# Patient Record
Sex: Female | Born: 1945 | Race: Black or African American | Hispanic: No | Marital: Single | Smoking: Former smoker
Health system: Southern US, Community
[De-identification: ages and names within clinical notes are randomized; demographics above are authoritative.]

## PROBLEM LIST (undated history)

## (undated) DIAGNOSIS — E079 Disorder of thyroid, unspecified: Secondary | ICD-10-CM

## (undated) DIAGNOSIS — I10 Essential (primary) hypertension: Secondary | ICD-10-CM

## (undated) DIAGNOSIS — I739 Peripheral vascular disease, unspecified: Secondary | ICD-10-CM

## (undated) HISTORY — PX: CHOLECYSTECTOMY: SHX55

## (undated) HISTORY — DX: Peripheral vascular disease, unspecified: I73.9

---

## 2010-04-18 ENCOUNTER — Ambulatory Visit: Payer: Self-pay | Admitting: Ophthalmology

## 2019-08-02 DIAGNOSIS — E039 Hypothyroidism, unspecified: Secondary | ICD-10-CM | POA: Diagnosis not present

## 2019-08-02 DIAGNOSIS — Z6823 Body mass index (BMI) 23.0-23.9, adult: Secondary | ICD-10-CM | POA: Diagnosis not present

## 2019-08-02 DIAGNOSIS — Z0000 Encounter for general adult medical examination without abnormal findings: Secondary | ICD-10-CM | POA: Diagnosis not present

## 2019-08-02 DIAGNOSIS — I10 Essential (primary) hypertension: Secondary | ICD-10-CM | POA: Diagnosis not present

## 2019-08-02 DIAGNOSIS — M8580 Other specified disorders of bone density and structure, unspecified site: Secondary | ICD-10-CM | POA: Diagnosis not present

## 2019-08-02 DIAGNOSIS — Z9189 Other specified personal risk factors, not elsewhere classified: Secondary | ICD-10-CM | POA: Diagnosis not present

## 2019-08-31 DIAGNOSIS — Z20822 Contact with and (suspected) exposure to covid-19: Secondary | ICD-10-CM | POA: Diagnosis not present

## 2019-08-31 DIAGNOSIS — Z01812 Encounter for preprocedural laboratory examination: Secondary | ICD-10-CM | POA: Diagnosis not present

## 2019-09-03 DIAGNOSIS — D120 Benign neoplasm of cecum: Secondary | ICD-10-CM | POA: Diagnosis not present

## 2019-09-03 DIAGNOSIS — D123 Benign neoplasm of transverse colon: Secondary | ICD-10-CM | POA: Diagnosis not present

## 2019-09-03 DIAGNOSIS — Z86010 Personal history of colonic polyps: Secondary | ICD-10-CM | POA: Diagnosis not present

## 2019-09-03 DIAGNOSIS — K5730 Diverticulosis of large intestine without perforation or abscess without bleeding: Secondary | ICD-10-CM | POA: Diagnosis not present

## 2019-09-03 DIAGNOSIS — Z1211 Encounter for screening for malignant neoplasm of colon: Secondary | ICD-10-CM | POA: Diagnosis not present

## 2019-12-15 DIAGNOSIS — Z1231 Encounter for screening mammogram for malignant neoplasm of breast: Secondary | ICD-10-CM | POA: Diagnosis not present

## 2020-02-10 DIAGNOSIS — E782 Mixed hyperlipidemia: Secondary | ICD-10-CM | POA: Diagnosis not present

## 2020-02-10 DIAGNOSIS — E039 Hypothyroidism, unspecified: Secondary | ICD-10-CM | POA: Diagnosis not present

## 2020-02-10 DIAGNOSIS — I10 Essential (primary) hypertension: Secondary | ICD-10-CM | POA: Diagnosis not present

## 2020-08-02 DIAGNOSIS — Z6824 Body mass index (BMI) 24.0-24.9, adult: Secondary | ICD-10-CM | POA: Diagnosis not present

## 2020-08-02 DIAGNOSIS — M8580 Other specified disorders of bone density and structure, unspecified site: Secondary | ICD-10-CM | POA: Diagnosis not present

## 2020-08-02 DIAGNOSIS — I10 Essential (primary) hypertension: Secondary | ICD-10-CM | POA: Diagnosis not present

## 2020-08-02 DIAGNOSIS — Z0000 Encounter for general adult medical examination without abnormal findings: Secondary | ICD-10-CM | POA: Diagnosis not present

## 2020-08-02 DIAGNOSIS — E039 Hypothyroidism, unspecified: Secondary | ICD-10-CM | POA: Diagnosis not present

## 2020-08-02 DIAGNOSIS — Z9189 Other specified personal risk factors, not elsewhere classified: Secondary | ICD-10-CM | POA: Diagnosis not present

## 2020-08-16 DIAGNOSIS — I10 Essential (primary) hypertension: Secondary | ICD-10-CM | POA: Diagnosis not present

## 2020-08-16 DIAGNOSIS — E039 Hypothyroidism, unspecified: Secondary | ICD-10-CM | POA: Diagnosis not present

## 2020-08-16 DIAGNOSIS — H9319 Tinnitus, unspecified ear: Secondary | ICD-10-CM | POA: Diagnosis not present

## 2020-09-13 DIAGNOSIS — H93A1 Pulsatile tinnitus, right ear: Secondary | ICD-10-CM | POA: Diagnosis not present

## 2020-09-13 DIAGNOSIS — H903 Sensorineural hearing loss, bilateral: Secondary | ICD-10-CM | POA: Diagnosis not present

## 2020-09-26 DIAGNOSIS — H903 Sensorineural hearing loss, bilateral: Secondary | ICD-10-CM | POA: Diagnosis not present

## 2020-09-27 DIAGNOSIS — H903 Sensorineural hearing loss, bilateral: Secondary | ICD-10-CM | POA: Diagnosis not present

## 2020-09-27 DIAGNOSIS — H93A1 Pulsatile tinnitus, right ear: Secondary | ICD-10-CM | POA: Diagnosis not present

## 2020-10-26 DIAGNOSIS — H5210 Myopia, unspecified eye: Secondary | ICD-10-CM | POA: Diagnosis not present

## 2020-10-26 DIAGNOSIS — E7800 Pure hypercholesterolemia, unspecified: Secondary | ICD-10-CM | POA: Diagnosis not present

## 2020-10-30 DIAGNOSIS — Z03818 Encounter for observation for suspected exposure to other biological agents ruled out: Secondary | ICD-10-CM | POA: Diagnosis not present

## 2020-12-15 DIAGNOSIS — M8589 Other specified disorders of bone density and structure, multiple sites: Secondary | ICD-10-CM | POA: Diagnosis not present

## 2020-12-15 DIAGNOSIS — Z780 Asymptomatic menopausal state: Secondary | ICD-10-CM | POA: Diagnosis not present

## 2020-12-15 DIAGNOSIS — Z1231 Encounter for screening mammogram for malignant neoplasm of breast: Secondary | ICD-10-CM | POA: Diagnosis not present

## 2021-01-08 DIAGNOSIS — U071 COVID-19: Secondary | ICD-10-CM | POA: Diagnosis not present

## 2021-02-19 DIAGNOSIS — Z9189 Other specified personal risk factors, not elsewhere classified: Secondary | ICD-10-CM | POA: Diagnosis not present

## 2021-02-19 DIAGNOSIS — E039 Hypothyroidism, unspecified: Secondary | ICD-10-CM | POA: Diagnosis not present

## 2021-02-19 DIAGNOSIS — I10 Essential (primary) hypertension: Secondary | ICD-10-CM | POA: Diagnosis not present

## 2021-03-26 DIAGNOSIS — M79662 Pain in left lower leg: Secondary | ICD-10-CM | POA: Diagnosis not present

## 2021-03-26 DIAGNOSIS — M25572 Pain in left ankle and joints of left foot: Secondary | ICD-10-CM | POA: Diagnosis not present

## 2021-03-27 DIAGNOSIS — M79662 Pain in left lower leg: Secondary | ICD-10-CM | POA: Diagnosis not present

## 2021-03-27 DIAGNOSIS — M25472 Effusion, left ankle: Secondary | ICD-10-CM | POA: Diagnosis not present

## 2021-04-10 DIAGNOSIS — M8580 Other specified disorders of bone density and structure, unspecified site: Secondary | ICD-10-CM | POA: Diagnosis not present

## 2021-04-10 DIAGNOSIS — K08409 Partial loss of teeth, unspecified cause, unspecified class: Secondary | ICD-10-CM | POA: Diagnosis not present

## 2021-04-10 DIAGNOSIS — Z818 Family history of other mental and behavioral disorders: Secondary | ICD-10-CM | POA: Diagnosis not present

## 2021-04-10 DIAGNOSIS — Z8379 Family history of other diseases of the digestive system: Secondary | ICD-10-CM | POA: Diagnosis not present

## 2021-04-10 DIAGNOSIS — E039 Hypothyroidism, unspecified: Secondary | ICD-10-CM | POA: Diagnosis not present

## 2021-04-10 DIAGNOSIS — Z791 Long term (current) use of non-steroidal anti-inflammatories (NSAID): Secondary | ICD-10-CM | POA: Diagnosis not present

## 2021-04-10 DIAGNOSIS — I10 Essential (primary) hypertension: Secondary | ICD-10-CM | POA: Diagnosis not present

## 2021-04-10 DIAGNOSIS — Z803 Family history of malignant neoplasm of breast: Secondary | ICD-10-CM | POA: Diagnosis not present

## 2021-04-10 DIAGNOSIS — Z8249 Family history of ischemic heart disease and other diseases of the circulatory system: Secondary | ICD-10-CM | POA: Diagnosis not present

## 2021-04-10 DIAGNOSIS — Z811 Family history of alcohol abuse and dependence: Secondary | ICD-10-CM | POA: Diagnosis not present

## 2021-04-10 DIAGNOSIS — Z823 Family history of stroke: Secondary | ICD-10-CM | POA: Diagnosis not present

## 2021-04-10 DIAGNOSIS — R69 Illness, unspecified: Secondary | ICD-10-CM | POA: Diagnosis not present

## 2021-04-10 DIAGNOSIS — E785 Hyperlipidemia, unspecified: Secondary | ICD-10-CM | POA: Diagnosis not present

## 2021-04-12 DIAGNOSIS — M76822 Posterior tibial tendinitis, left leg: Secondary | ICD-10-CM | POA: Diagnosis not present

## 2021-05-29 DIAGNOSIS — M76829 Posterior tibial tendinitis, unspecified leg: Secondary | ICD-10-CM | POA: Diagnosis not present

## 2021-05-29 DIAGNOSIS — R2242 Localized swelling, mass and lump, left lower limb: Secondary | ICD-10-CM | POA: Diagnosis not present

## 2021-06-08 DIAGNOSIS — M25572 Pain in left ankle and joints of left foot: Secondary | ICD-10-CM | POA: Diagnosis not present

## 2021-06-08 DIAGNOSIS — R2242 Localized swelling, mass and lump, left lower limb: Secondary | ICD-10-CM | POA: Diagnosis not present

## 2021-06-14 DIAGNOSIS — M76822 Posterior tibial tendinitis, left leg: Secondary | ICD-10-CM | POA: Diagnosis not present

## 2021-08-20 DIAGNOSIS — B349 Viral infection, unspecified: Secondary | ICD-10-CM | POA: Diagnosis not present

## 2021-08-24 DIAGNOSIS — Z1231 Encounter for screening mammogram for malignant neoplasm of breast: Secondary | ICD-10-CM | POA: Diagnosis not present

## 2021-08-24 DIAGNOSIS — Z0000 Encounter for general adult medical examination without abnormal findings: Secondary | ICD-10-CM | POA: Diagnosis not present

## 2021-08-24 DIAGNOSIS — M25512 Pain in left shoulder: Secondary | ICD-10-CM | POA: Diagnosis not present

## 2021-08-24 DIAGNOSIS — M8580 Other specified disorders of bone density and structure, unspecified site: Secondary | ICD-10-CM | POA: Diagnosis not present

## 2021-08-24 DIAGNOSIS — Z23 Encounter for immunization: Secondary | ICD-10-CM | POA: Diagnosis not present

## 2021-08-24 DIAGNOSIS — Z1159 Encounter for screening for other viral diseases: Secondary | ICD-10-CM | POA: Diagnosis not present

## 2021-08-24 DIAGNOSIS — Z79899 Other long term (current) drug therapy: Secondary | ICD-10-CM | POA: Diagnosis not present

## 2021-08-24 DIAGNOSIS — I10 Essential (primary) hypertension: Secondary | ICD-10-CM | POA: Diagnosis not present

## 2021-08-24 DIAGNOSIS — E7800 Pure hypercholesterolemia, unspecified: Secondary | ICD-10-CM | POA: Diagnosis not present

## 2021-08-24 DIAGNOSIS — M85852 Other specified disorders of bone density and structure, left thigh: Secondary | ICD-10-CM | POA: Diagnosis not present

## 2021-08-24 DIAGNOSIS — E890 Postprocedural hypothyroidism: Secondary | ICD-10-CM | POA: Diagnosis not present

## 2021-09-04 ENCOUNTER — Other Ambulatory Visit: Payer: Self-pay | Admitting: Family Medicine

## 2021-09-04 DIAGNOSIS — Z1231 Encounter for screening mammogram for malignant neoplasm of breast: Secondary | ICD-10-CM

## 2021-09-06 DIAGNOSIS — M25512 Pain in left shoulder: Secondary | ICD-10-CM | POA: Diagnosis not present

## 2021-11-30 DIAGNOSIS — J40 Bronchitis, not specified as acute or chronic: Secondary | ICD-10-CM | POA: Diagnosis not present

## 2021-11-30 DIAGNOSIS — R059 Cough, unspecified: Secondary | ICD-10-CM | POA: Diagnosis not present

## 2021-12-17 ENCOUNTER — Ambulatory Visit
Admission: RE | Admit: 2021-12-17 | Discharge: 2021-12-17 | Disposition: A | Discharge status: Home / Self Care | Payer: Medicare HMO | Source: Ambulatory Visit | Attending: Family Medicine | Admitting: Family Medicine

## 2021-12-17 DIAGNOSIS — Z1231 Encounter for screening mammogram for malignant neoplasm of breast: Secondary | ICD-10-CM | POA: Diagnosis not present

## 2022-01-04 DIAGNOSIS — Z8249 Family history of ischemic heart disease and other diseases of the circulatory system: Secondary | ICD-10-CM | POA: Diagnosis not present

## 2022-01-04 DIAGNOSIS — Z880 Allergy status to penicillin: Secondary | ICD-10-CM | POA: Diagnosis not present

## 2022-01-04 DIAGNOSIS — Z833 Family history of diabetes mellitus: Secondary | ICD-10-CM | POA: Diagnosis not present

## 2022-01-04 DIAGNOSIS — Z809 Family history of malignant neoplasm, unspecified: Secondary | ICD-10-CM | POA: Diagnosis not present

## 2022-01-04 DIAGNOSIS — Z823 Family history of stroke: Secondary | ICD-10-CM | POA: Diagnosis not present

## 2022-01-04 DIAGNOSIS — M1990 Unspecified osteoarthritis, unspecified site: Secondary | ICD-10-CM | POA: Diagnosis not present

## 2022-01-04 DIAGNOSIS — Z87891 Personal history of nicotine dependence: Secondary | ICD-10-CM | POA: Diagnosis not present

## 2022-01-04 DIAGNOSIS — Z791 Long term (current) use of non-steroidal anti-inflammatories (NSAID): Secondary | ICD-10-CM | POA: Diagnosis not present

## 2022-01-04 DIAGNOSIS — E785 Hyperlipidemia, unspecified: Secondary | ICD-10-CM | POA: Diagnosis not present

## 2022-01-04 DIAGNOSIS — E890 Postprocedural hypothyroidism: Secondary | ICD-10-CM | POA: Diagnosis not present

## 2022-01-04 DIAGNOSIS — Z008 Encounter for other general examination: Secondary | ICD-10-CM | POA: Diagnosis not present

## 2022-01-04 DIAGNOSIS — I10 Essential (primary) hypertension: Secondary | ICD-10-CM | POA: Diagnosis not present

## 2022-01-04 DIAGNOSIS — E559 Vitamin D deficiency, unspecified: Secondary | ICD-10-CM | POA: Diagnosis not present

## 2022-01-22 DIAGNOSIS — R059 Cough, unspecified: Secondary | ICD-10-CM | POA: Diagnosis not present

## 2022-01-22 DIAGNOSIS — J209 Acute bronchitis, unspecified: Secondary | ICD-10-CM | POA: Diagnosis not present

## 2022-02-21 DIAGNOSIS — E890 Postprocedural hypothyroidism: Secondary | ICD-10-CM | POA: Diagnosis not present

## 2022-02-21 DIAGNOSIS — E7800 Pure hypercholesterolemia, unspecified: Secondary | ICD-10-CM | POA: Diagnosis not present

## 2022-02-21 DIAGNOSIS — I10 Essential (primary) hypertension: Secondary | ICD-10-CM | POA: Diagnosis not present

## 2022-02-21 DIAGNOSIS — E559 Vitamin D deficiency, unspecified: Secondary | ICD-10-CM | POA: Diagnosis not present

## 2022-02-21 DIAGNOSIS — M25552 Pain in left hip: Secondary | ICD-10-CM | POA: Diagnosis not present

## 2022-02-21 DIAGNOSIS — Z23 Encounter for immunization: Secondary | ICD-10-CM | POA: Diagnosis not present

## 2022-02-21 DIAGNOSIS — Z7185 Encounter for immunization safety counseling: Secondary | ICD-10-CM | POA: Diagnosis not present

## 2022-02-21 DIAGNOSIS — Z1211 Encounter for screening for malignant neoplasm of colon: Secondary | ICD-10-CM | POA: Diagnosis not present

## 2022-04-03 DIAGNOSIS — M160 Bilateral primary osteoarthritis of hip: Secondary | ICD-10-CM | POA: Diagnosis not present

## 2022-04-03 DIAGNOSIS — M25551 Pain in right hip: Secondary | ICD-10-CM | POA: Diagnosis not present

## 2022-04-03 DIAGNOSIS — M25552 Pain in left hip: Secondary | ICD-10-CM | POA: Diagnosis not present

## 2022-04-30 ENCOUNTER — Ambulatory Visit: Payer: Medicare HMO | Admitting: Podiatry

## 2022-04-30 ENCOUNTER — Ambulatory Visit (INDEPENDENT_AMBULATORY_CARE_PROVIDER_SITE_OTHER): Payer: Medicare HMO

## 2022-04-30 DIAGNOSIS — S92514B Nondisplaced fracture of proximal phalanx of right lesser toe(s), initial encounter for open fracture: Secondary | ICD-10-CM

## 2022-04-30 DIAGNOSIS — L600 Ingrowing nail: Secondary | ICD-10-CM | POA: Diagnosis not present

## 2022-04-30 DIAGNOSIS — S92504A Nondisplaced unspecified fracture of right lesser toe(s), initial encounter for closed fracture: Secondary | ICD-10-CM | POA: Diagnosis not present

## 2022-04-30 DIAGNOSIS — M79671 Pain in right foot: Secondary | ICD-10-CM

## 2022-04-30 NOTE — Progress Notes (Signed)
Chief Complaint  Patient presents with   Ingrown Toenail    Possible Ingrrowns on right foot, great toe, and 3rd digit    HPI: 76 y.o. female presenting today as a new patient for evaluation of 2 separate complaints.  First, the patient states that she received a pedicure about a month ago and ever since then she has been having some tenderness to the toenail of the right great toe as well as the right third toe.  She is concerned for possible ingrown toenail.  She generally has never had this pain before up until her pedicure that she received from a different pedicurist at a different location that she normally goes to.  Patient also states that a few months ago she got her right fourth toe caught on a sandal and bent it backwards.  She says that she subsequently developed pain and tenderness with swelling to the right fourth toe and she would like to have it evaluated today.  Over the last few months the pain has improved but she continues to have some achiness with swelling  No past medical history on file.  No past surgical history on file.  Allergies  Allergen Reactions   Lisinopril     Other reaction(s): cough   Penicillins Hives and Rash     Physical Exam: General: The patient is alert and oriented x3 in no acute distress.  Dermatology: Skin is warm, dry and supple bilateral lower extremities. Negative for open lesions or macerations.  Mild evidence of an ingrowing toenail to the medial border of the right great toe as well as the lateral border of the right third toe.  Again, this is very mild with no inflammatory process or infection.  There is however tenderness and sensitivity with palpation to these areas.  Vascular: Palpable pedal pulses bilaterally. Capillary refill within normal limits.  Negative for any significant edema or erythema  Neurological: Light touch and protective threshold grossly intact  Musculoskeletal Exam: No pedal deformities noted.  There is  tenderness with palpation to the right fourth toe consistent with fracture visualized on radiographic exam.  Overall good alignment of the toes.  Radiographic Exam RT foot 04/30/2022:  Normal osseous mineralization.  Prior surgery to the DIPJ of the fourth digit right as well as the fifth digit consistent with hammertoe arthroplasty type surgery.  There is a chronic healing fracture noted transversely along the distal head of the proximal phalanx with routine healing.  Assessment: 1.  Mild ingrowing toenail medial border right great toe and lateral border right third toe 2.  Fracture proximal phalanx right fourth toe nondisplaced with routine healing x2 months   Plan of Care:  1. Patient evaluated. X-Rays reviewed.  2.  Recommend good supportive shoes that do not irritate or constrict the toebox area and explained to the patient that the toe fracture should resolve over the next month or 2 3.  In regards to the ingrown toenails, for now recommend conservative treatment.  Recommend daily lotion and massaging the nail fold away from the nail plate to see if the symptoms resolve over the next few weeks.  She has never had ingrown toenails before and these are very mild although they are sensitive with palpation.  In 2 weeks if there is no improvement return to clinic for partial nail matricectomy       Edrick Kins, DPM [facility]  Dr. Edrick Kins, DPM    [address]  [place], [place] [address]                Office [phone]  Fax [phone]

## 2022-05-01 ENCOUNTER — Other Ambulatory Visit: Payer: Self-pay | Admitting: Podiatry

## 2022-05-01 DIAGNOSIS — S92514B Nondisplaced fracture of proximal phalanx of right lesser toe(s), initial encounter for open fracture: Secondary | ICD-10-CM

## 2022-05-13 ENCOUNTER — Ambulatory Visit: Payer: Medicare HMO | Admitting: Podiatry

## 2022-05-13 DIAGNOSIS — L600 Ingrowing nail: Secondary | ICD-10-CM

## 2022-05-13 MED ORDER — MUPIROCIN 2 % EX OINT: 1.0000 | TOPICAL_OINTMENT | Freq: Two times a day (BID) | CUTANEOUS | 1 refills | Status: DC

## 2022-05-13 NOTE — Progress Notes (Signed)
   Chief Complaint  Patient presents with   Ingrown Toenail    Patient is here for  right foot ingrown toe nail great toe and 2nd toe.    Subjective: Patient presents today for evaluation of pain to the medial border right great toe as well as the lateral border right third toe. Patient is concerned for possible ingrown nail.  She has tried massaging the area over the last 2 weeks and soaking it with antibiotic ointment with no improvement or relief of symptoms.  It is very sensitive to touch.  Patient presents today for further treatment and evaluation.  No past medical history on file.  Objective:  General: Well developed, nourished, in no acute distress, alert and oriented x3   Dermatology: Skin is warm, dry and supple bilateral.  Medial border right great toe lateral border right third toe is tender with evidence of an ingrowing nail. Pain on palpation noted to the border of the nail fold. The remaining nails appear unremarkable at this time. There are no open sores, lesions.  Vascular: DP and PT pulses palpable.  No clinical evidence of vascular compromise  Neruologic: Grossly intact via light touch bilateral.  Musculoskeletal: No pedal deformity noted  Assesement: #1 Paronychia with ingrowing nail medial border right great toenail lateral border right third toe #2 fracture proximal phalanx right fourth toe nondisplaced with routine healing x2 months  Plan of Care:  1. Patient evaluated.  2. Discussed treatment alternatives and plan of care. Explained nail avulsion procedure and post procedure course to patient. 3. Patient opted for permanent partial nail avulsion of the ingrown portion of the nail.  4. Prior to procedure, local anesthesia infiltration utilized using 3 ml of a 50:50 mixture of 2% plain lidocaine and 0.5% plain marcaine in a normal hallux block fashion and a betadine prep performed.  5. Partial permanent nail avulsion with chemical matrixectomy performed using  [id number] applications of phenol followed by alcohol flush.  6. Light dressing applied.  Post care instructions provided 7.  Prescription for mupirocin 2% ointment 8.  Return to clinic 2 weeks.  Edrick Kins, DPM [facility]  Dr. Edrick Kins, DPM    [address], [place] [address]                Office [phone]  Fax [phone]

## 2022-06-05 ENCOUNTER — Ambulatory Visit: Payer: Medicare HMO | Admitting: Podiatry

## 2022-06-05 VITALS — BP 143/66 | HR 95

## 2022-06-05 DIAGNOSIS — L600 Ingrowing nail: Secondary | ICD-10-CM

## 2022-06-05 NOTE — Progress Notes (Signed)
   Chief Complaint  Patient presents with   Follow-up    Patient is here for right foot ingrown Great toe follow-up, patient states that she is still having some burning.    Subjective: 76 y.o. female presents today status post permanent nail avulsion procedure of the medial border of the right great toe that was performed on 05/13/2022.  Patient states that she is doing well.  She continues to have some pain and tenderness to the area however.  Overall there is improvement.   No past medical history on file.  Objective: Neurovascular status intact.  Skin is warm, dry and supple. Nail and respective nail fold appears to be healing appropriately.   Assessment: #1 s/p partial permanent nail matrixectomy medial border right great toe   Plan of care: #1 patient was evaluated  #2 light debridement of the periungual debris was performed to the border of the respective toe and nail plate using a tissue nipper. #3  Continue triple antibiotic daily for an additional week until the area completely resolves and heals  #4 patient is to return to clinic on a PRN basis.   Edrick Kins, DPM [facility]  Dr. Edrick Kins, DPM    [address], [place] [address]                Office [phone]  Fax [phone]

## 2022-06-10 ENCOUNTER — Ambulatory Visit: Payer: Medicare HMO | Admitting: Podiatry

## 2022-07-02 DIAGNOSIS — J189 Pneumonia, unspecified organism: Secondary | ICD-10-CM | POA: Diagnosis not present

## 2022-07-31 DIAGNOSIS — Z86010 Personal history of colonic polyps: Secondary | ICD-10-CM | POA: Diagnosis not present

## 2022-07-31 DIAGNOSIS — K5730 Diverticulosis of large intestine without perforation or abscess without bleeding: Secondary | ICD-10-CM | POA: Diagnosis not present

## 2022-08-14 ENCOUNTER — Ambulatory Visit (INDEPENDENT_AMBULATORY_CARE_PROVIDER_SITE_OTHER): Payer: Medicare HMO

## 2022-08-14 ENCOUNTER — Ambulatory Visit: Payer: Medicare HMO | Admitting: Podiatry

## 2022-08-14 DIAGNOSIS — R252 Cramp and spasm: Secondary | ICD-10-CM

## 2022-08-14 DIAGNOSIS — M25871 Other specified joint disorders, right ankle and foot: Secondary | ICD-10-CM | POA: Diagnosis not present

## 2022-08-14 DIAGNOSIS — M79671 Pain in right foot: Secondary | ICD-10-CM

## 2022-08-14 MED ORDER — BETAMETHASONE SOD PHOS & ACET 6 (3-3) MG/ML IJ SUSP
3.0000 mg | Freq: Once | INTRAMUSCULAR | Status: AC
  Administered 2022-08-14: 3 mg via INTRA_ARTICULAR

## 2022-08-14 MED ORDER — GABAPENTIN 100 MG PO CAPS: 100.0000 mg | ORAL_CAPSULE | Freq: Every day | ORAL | 1 refills | Status: DC

## 2022-08-14 NOTE — Progress Notes (Signed)
   Chief Complaint  Patient presents with   Foot Problem    Patient came in today for cramping in the ball of the foot started 2 months ago, X-rays taken today     HPI: 77 y.o. female presenting today for complaint of pain and tenderness now to the right forefoot.  She does have history of partial nail matricectomy with good healing.  Patient states that she is experiencing pain underneath her great toe joint as well as nocturnal cramps.  Patient states that approximately 3 AM in the morning she begins to experience pain and cramping to the foot.  It is consistent almost nightly.  She has not anything for treatment.  Denies a history of injury or change in shoe gear or activity.  No past medical history on file.  No past surgical history on file.  Allergies  Allergen Reactions   Lisinopril     Other reaction(s): cough   Penicillins Hives and Rash     Physical Exam: General: The patient is alert and oriented x3 in no acute distress.  Dermatology: Skin is warm, dry and supple bilateral lower extremities. Negative for open lesions or macerations.  Vascular: Palpable pedal pulses bilaterally. Capillary refill within normal limits.  Negative for any significant edema or erythema  Neurological: Light touch and protective threshold grossly intact  Musculoskeletal Exam: No pedal deformities noted.  There is tenderness with palpation along the sesamoid apparatus of the right foot consistent with sesamoiditis  Radiographic Exam RT foot 08/14/2021:  Normal osseous mineralization. Joint spaces preserved. No fracture/dislocation/boney destruction.    Assessment: 1.  Sesamoiditis right foot 2.  Nocturnal foot cramps right   Plan of Care:  1. Patient evaluated. X-Rays reviewed.  2.  Injection of 0.5 cc Celestone Soluspan injected in the sesamoid apparatus right foot 3.  Prescription for gabapentin 100 mg QHS to see if this helps alleviate some of the patient's nocturnal pain 4.  Continue  wearing good supportive shoes and sneakers 5.  Return to clinic 4 weeks      Edrick Kins, DPM [facility]  Dr. Edrick Kins, DPM    [address], [place] [address]                Office [phone]  Fax [phone]

## 2022-09-03 DIAGNOSIS — D124 Benign neoplasm of descending colon: Secondary | ICD-10-CM | POA: Diagnosis not present

## 2022-09-03 DIAGNOSIS — Z09 Encounter for follow-up examination after completed treatment for conditions other than malignant neoplasm: Secondary | ICD-10-CM | POA: Diagnosis not present

## 2022-09-03 DIAGNOSIS — D120 Benign neoplasm of cecum: Secondary | ICD-10-CM | POA: Diagnosis not present

## 2022-09-03 DIAGNOSIS — Z86010 Personal history of colonic polyps: Secondary | ICD-10-CM | POA: Diagnosis not present

## 2022-09-03 DIAGNOSIS — K648 Other hemorrhoids: Secondary | ICD-10-CM | POA: Diagnosis not present

## 2022-09-05 DIAGNOSIS — D120 Benign neoplasm of cecum: Secondary | ICD-10-CM | POA: Diagnosis not present

## 2022-09-05 DIAGNOSIS — D124 Benign neoplasm of descending colon: Secondary | ICD-10-CM | POA: Diagnosis not present

## 2022-09-06 ENCOUNTER — Ambulatory Visit
Admission: RE | Admit: 2022-09-06 | Discharge: 2022-09-06 | Disposition: A | Discharge status: Home / Self Care | Payer: Medicare HMO | Source: Ambulatory Visit | Attending: Family Medicine | Admitting: Family Medicine

## 2022-09-06 ENCOUNTER — Other Ambulatory Visit: Payer: Self-pay | Admitting: Family Medicine

## 2022-09-06 DIAGNOSIS — R600 Localized edema: Secondary | ICD-10-CM | POA: Diagnosis not present

## 2022-09-06 DIAGNOSIS — Z79899 Other long term (current) drug therapy: Secondary | ICD-10-CM | POA: Diagnosis not present

## 2022-09-06 DIAGNOSIS — R059 Cough, unspecified: Secondary | ICD-10-CM | POA: Diagnosis not present

## 2022-09-06 DIAGNOSIS — Z23 Encounter for immunization: Secondary | ICD-10-CM | POA: Diagnosis not present

## 2022-09-06 DIAGNOSIS — Z9181 History of falling: Secondary | ICD-10-CM | POA: Diagnosis not present

## 2022-09-06 DIAGNOSIS — R0609 Other forms of dyspnea: Secondary | ICD-10-CM

## 2022-09-06 DIAGNOSIS — E559 Vitamin D deficiency, unspecified: Secondary | ICD-10-CM | POA: Diagnosis not present

## 2022-09-06 DIAGNOSIS — I10 Essential (primary) hypertension: Secondary | ICD-10-CM | POA: Diagnosis not present

## 2022-09-06 DIAGNOSIS — E7800 Pure hypercholesterolemia, unspecified: Secondary | ICD-10-CM | POA: Diagnosis not present

## 2022-09-06 DIAGNOSIS — Z0000 Encounter for general adult medical examination without abnormal findings: Secondary | ICD-10-CM | POA: Diagnosis not present

## 2022-09-06 DIAGNOSIS — E890 Postprocedural hypothyroidism: Secondary | ICD-10-CM | POA: Diagnosis not present

## 2022-09-11 ENCOUNTER — Ambulatory Visit: Payer: Medicare HMO | Admitting: Podiatry

## 2022-09-11 ENCOUNTER — Encounter: Payer: Self-pay | Admitting: Student

## 2022-09-11 ENCOUNTER — Ambulatory Visit: Payer: Medicare HMO | Admitting: Student

## 2022-09-11 VITALS — BP 116/72 | HR 82 | Ht 61.5 in | Wt 125.6 lb

## 2022-09-11 DIAGNOSIS — I70221 Atherosclerosis of native arteries of extremities with rest pain, right leg: Secondary | ICD-10-CM | POA: Diagnosis not present

## 2022-09-11 DIAGNOSIS — R0600 Dyspnea, unspecified: Secondary | ICD-10-CM

## 2022-09-11 DIAGNOSIS — R053 Chronic cough: Secondary | ICD-10-CM

## 2022-09-11 MED ORDER — CYCLOBENZAPRINE HCL 10 MG PO TABS: 10.0000 mg | ORAL_TABLET | Freq: Every day | ORAL | 0 refills | Status: DC

## 2022-09-11 NOTE — Patient Instructions (Signed)
-   Breztri 2 puffs twice daily rinse mouth and brush tongue/teeth after each use - albuterol 1-2 puffs as needed this will be your rescue inhaler - PFTs (breathing tests) on same day as next visit in 3 months. Ideally try to stop your breztri (or alternative maintenance inhaler) 2-3 weeks before your PFTs - see you in 3 months or sooner if need be!

## 2022-09-11 NOTE — Progress Notes (Addendum)
   Chief Complaint  Patient presents with   Foot Pain    4 week follow up  Pt stated that things are about the same     HPI: 77 y.o. female presenting today for follow-up evaluation of pain and tenderness now to the right forefoot that only occurs at nighttime.  Patient states that the injection and the gabapentin does not help alleviate any of her symptoms.  She continues to have cramping sensation with pain on a nightly basis.  No past medical history on file.  No past surgical history on file.  Allergies  Allergen Reactions   Lisinopril     Other reaction(s): cough   Penicillins Hives and Rash     Physical Exam: General: The patient is alert and oriented x3 in no acute distress.  Dermatology: Skin is warm, dry and supple bilateral lower extremities. Negative for open lesions or macerations.  Vascular: Skin is cool to touch.  Capillary refill slightly delayed.  No edema or erythema.  Pulses seem to be diminished  Neurological: Light touch and protective threshold grossly intact  Musculoskeletal Exam: No pedal deformities noted.    Radiographic Exam RT foot 08/14/2021:  Normal osseous mineralization. Joint spaces preserved. No fracture/dislocation/boney destruction.    Assessment: 1.  Rest pain right lower extremity 2.  Nocturnal foot cramps right  -Discontinue gabapentin since she says it does not help alleviate any of her symptoms -Order placed for ABIs right lower extremity to rule out any vascular compromise -Prescription for Flexeril 10 mg nightly -Will plan to reach out to the patient via telephone when results of ABIs are available -Return to clinic as needed for now      Edrick Kins, DPM [facility]  Dr. Edrick Kins, DPM    [address], [place] [address]                Office [phone]  Fax [phone]

## 2022-09-11 NOTE — Progress Notes (Signed)
Synopsis: Referred for dyspnea by Caren Macadam, MD  Subjective:   PATIENT ID: Renetta Chalk GENDER: female DOB: [birth year], MRN: [id number]  Chief Complaint  Patient presents with   Consult    Sob during exertion     77yF with spinal stenosis, hypothyroid, ACEi cough, HTN, history of smoking - (smoked for 33 py quit in 1989), mild covid-19 infection, who is referred for dyspnea  She says that she has had frequent bouts of cough, dyspnea. She has albuterol inhaler and she does think it's helpful for her wheezy cough. She had recent course of prednisone which she thinks was helpful as well for cough, dyspnea. She says she has some swelling in her legs which isn't necessarily getting any worse. She has no orthpnea. No sinus congestion pnd. She has some intermittent reflux.   She has upcoming TTE  Has never had a stress test  Otherwise pertinent review of systems is negative.  Half sister with COPD  She worked Psychologist, educational cigarettes, was English as a second language teacher for [employer]. No recent travel. Has no pets.     No past medical history on file.   Family History  Problem Relation Age of Onset   Breast cancer Mother    Breast cancer Maternal Aunt      No past surgical history on file.  Social History   Socioeconomic History   Marital status: Single    Spouse name: Not on file   Number of children: Not on file   Years of education: Not on file   Highest education level: Not on file  Occupational History   Not on file  Tobacco Use   Smoking status: Former    Types: Cigarettes    Quit date: 1989    Years since quitting: 35.2   Smokeless tobacco: Not on file  Substance and Sexual Activity   Alcohol use: Not on file   Drug use: Not on file   Sexual activity: Not on file  Other Topics Concern   Not on file  Social History Narrative   Not on file   Social Determinants of Health   Financial Resource Strain: Not on file  Food Insecurity: Not on file   Transportation Needs: Not on file  Physical Activity: Not on file  Stress: Not on file  Social Connections: Not on file  Intimate Partner Violence: Not on file     Allergies  Allergen Reactions   Lisinopril     Other reaction(s): cough   Penicillins Hives and Rash     Outpatient Medications Prior to Visit  Medication Sig Dispense Refill   cyclobenzaprine (FLEXERIL) 10 MG tablet Take 1 tablet (10 mg total) by mouth at bedtime. 30 tablet 0   Vitamin D, Ergocalciferol, (DRISDOL) 1.25 MG (50000 UNIT) CAPS capsule Take 50,000 Units by mouth once a week.     gabapentin (NEURONTIN) 100 MG capsule Take 1 capsule (100 mg total) by mouth at bedtime. (Patient not taking: Reported on 09/11/2022) 60 capsule 1   mupirocin ointment (BACTROBAN) 2 % Apply 1 Application topically 2 (two) times daily. (Patient not taking: Reported on 09/11/2022) 22 g 1   No facility-administered medications prior to visit.       Objective:   Physical Exam:  General appearance: 77 y.o., female, NAD, conversant  Eyes: anicteric sclerae; PERRL, tracking appropriately HENT: NCAT; MMM Neck: Trachea midline; no lymphadenopathy, no JVD Lungs: CTAB, no crackles, no wheeze, with normal respiratory effort CV: RRR, no murmur  Abdomen: Soft, non-tender;  non-distended, BS present  Extremities: No peripheral edema, warm Skin: Normal turgor and texture; no rash Psych: Appropriate affect Neuro: Alert and oriented to person and place, no focal deficit     Vitals:   09/11/22 1306  BP: 116/72  Pulse: 82  SpO2: 95%  Weight: 125 lb 9.6 oz (57 kg)  Height: 5' 1.5" (1.562 m)   95% on RA BMI Readings from Last 3 Encounters:  09/11/22 23.35 kg/m   Wt Readings from Last 3 Encounters:  09/11/22 125 lb 9.6 oz (57 kg)     CBC No results found for: "WBC", "RBC", "HGB", "HCT", "PLT", "MCV", "MCH", "MCHC", "RDW", "LYMPHSABS", "MONOABS", "EOSABS", "BASOSABS"   Chest Imaging: CXR 09/26/22 reviewed by me clear  lungs  Pulmonary Functions Testing Results:     No data to display             Assessment & Plan:   # Episodic cough, dyspnea At risk for ACOS  Plan: - Breztri 2 puffs twice daily rinse mouth and brush tongue/teeth after each use - albuterol 1-2 puffs as needed this will be your rescue inhaler - PFTs (breathing tests) on same day as next visit in 3 months. Ideally try to stop your breztri (or alternative maintenance inhaler) 2-3 weeks before your PFTs - see you in 3 months or sooner if need be!     Maryjane Hurter, MD [facility] 09/11/2022 1:16 PM

## 2022-09-13 DIAGNOSIS — M7061 Trochanteric bursitis, right hip: Secondary | ICD-10-CM | POA: Diagnosis not present

## 2022-09-13 DIAGNOSIS — M160 Bilateral primary osteoarthritis of hip: Secondary | ICD-10-CM | POA: Diagnosis not present

## 2022-09-13 DIAGNOSIS — M7062 Trochanteric bursitis, left hip: Secondary | ICD-10-CM | POA: Diagnosis not present

## 2022-09-18 ENCOUNTER — Encounter: Payer: Self-pay | Admitting: Student

## 2022-09-19 ENCOUNTER — Ambulatory Visit (HOSPITAL_COMMUNITY)
Admission: RE | Admit: 2022-09-19 | Discharge: 2022-09-19 | Disposition: A | Discharge status: Home / Self Care | Payer: Medicare HMO | Source: Ambulatory Visit | Attending: Podiatry | Admitting: Podiatry

## 2022-09-19 DIAGNOSIS — I70221 Atherosclerosis of native arteries of extremities with rest pain, right leg: Secondary | ICD-10-CM | POA: Diagnosis not present

## 2022-09-19 LAB — VAS US ABI WITH/WO TBI
Left ABI: 1.15
Right ABI: 0.66

## 2022-09-20 ENCOUNTER — Other Ambulatory Visit (HOSPITAL_COMMUNITY): Payer: Self-pay

## 2022-09-20 DIAGNOSIS — R0609 Other forms of dyspnea: Secondary | ICD-10-CM | POA: Diagnosis not present

## 2022-09-20 NOTE — Telephone Encounter (Signed)
At this time Shannon Jenkins is showing as covered with a $47.00 co-pay. The other triple therapy option of Trelegy is also a $47.00 co-pay at this time.

## 2022-09-23 MED ORDER — BREZTRI AEROSPHERE 160-9-4.8 MCG/ACT IN AERO: 2.0000 | INHALATION_SPRAY | Freq: Two times a day (BID) | RESPIRATORY_TRACT | 4 refills | Status: DC

## 2022-09-24 ENCOUNTER — Other Ambulatory Visit: Payer: Self-pay | Admitting: Podiatry

## 2022-09-24 DIAGNOSIS — I70221 Atherosclerosis of native arteries of extremities with rest pain, right leg: Secondary | ICD-10-CM

## 2022-09-24 NOTE — Progress Notes (Signed)
Referral placed to [facility] in [place].

## 2022-10-17 DIAGNOSIS — I739 Peripheral vascular disease, unspecified: Secondary | ICD-10-CM | POA: Diagnosis not present

## 2022-10-17 DIAGNOSIS — E890 Postprocedural hypothyroidism: Secondary | ICD-10-CM | POA: Diagnosis not present

## 2022-10-17 DIAGNOSIS — I10 Essential (primary) hypertension: Secondary | ICD-10-CM | POA: Diagnosis not present

## 2022-10-17 DIAGNOSIS — J4489 Other specified chronic obstructive pulmonary disease: Secondary | ICD-10-CM | POA: Diagnosis not present

## 2022-10-17 DIAGNOSIS — Z1231 Encounter for screening mammogram for malignant neoplasm of breast: Secondary | ICD-10-CM | POA: Diagnosis not present

## 2022-10-18 ENCOUNTER — Ambulatory Visit: Payer: Medicare HMO | Admitting: Vascular Surgery

## 2022-10-18 ENCOUNTER — Encounter: Payer: Self-pay | Admitting: Vascular Surgery

## 2022-10-18 ENCOUNTER — Other Ambulatory Visit: Payer: Self-pay | Admitting: Family Medicine

## 2022-10-18 VITALS — BP 125/74 | HR 84 | Temp 97.7°F | Resp 14 | Ht 61.5 in | Wt 125.0 lb

## 2022-10-18 DIAGNOSIS — I70221 Atherosclerosis of native arteries of extremities with rest pain, right leg: Secondary | ICD-10-CM

## 2022-10-18 DIAGNOSIS — Z1231 Encounter for screening mammogram for malignant neoplasm of breast: Secondary | ICD-10-CM

## 2022-10-18 NOTE — Progress Notes (Signed)
Patient ID: Shannon Jenkins, female   DOB: 02-May-1946, 77 y.o.   MRN: [id number]  Reason for Consult: New Patient (Initial Visit) (Abnormal ABI'S  right leg pain radiating to right buttock)   Referred by Shannon Jenkins, DPM  Subjective:     HPI:  Shannon Jenkins is a 77 y.o. female has been evaluated for nocturnal foot pain with podiatry and found to have significantly decreased ABIs.  She is a former smoker but quit many years ago.  No other risk factors for vascular disease.  She states that she walks without limitation.  She denies any tissue loss or ulceration no history of stroke, TIA or amaurosis and no history of coronary artery disease.  She takes a statin weekly and has been prescribed 81 mg of aspirin has yet to start.  States that her foot pain really comes on the right foot in the middle the night and resolves with hanging her foot over the side of the bed.  Denies any previous lower extremity interventions and has not had this issue up until about 1 month ago.  History reviewed. No pertinent past medical history. Family History  Problem Relation Age of Onset   Breast cancer Mother    Breast cancer Maternal Aunt    History reviewed. No pertinent surgical history.  Short Social History:  Social History   Tobacco Use   Smoking status: Former    Types: Cigarettes    Quit date: 1989    Years since quitting: 35.3   Smokeless tobacco: Not on file  Substance Use Topics   Alcohol use: Not on file    Allergies  Allergen Reactions   Lisinopril     Other reaction(s): cough   Penicillins Hives and Rash    Current Outpatient Medications  Medication Sig Dispense Refill   Budeson-Glycopyrrol-Formoterol (BREZTRI AEROSPHERE) 160-9-4.8 MCG/ACT AERO Inhale 2 puffs into the lungs in the morning and at bedtime. 10.7 g 4   cyclobenzaprine (FLEXERIL) 10 MG tablet Take 1 tablet (10 mg total) by mouth at bedtime. 30 tablet 0   gabapentin (NEURONTIN) 100 MG capsule Take 1 capsule  (100 mg total) by mouth at bedtime. (Patient not taking: Reported on 09/11/2022) 60 capsule 1   mupirocin ointment (BACTROBAN) 2 % Apply 1 Application topically 2 (two) times daily. (Patient not taking: Reported on 09/11/2022) 22 g 1   Vitamin D, Ergocalciferol, (DRISDOL) 1.25 MG (50000 UNIT) CAPS capsule Take 50,000 Units by mouth once a week.     No current facility-administered medications for this visit.    Review of Systems  Constitutional:  Constitutional negative. HENT: HENT negative.  Eyes: Eyes negative.  Respiratory: Respiratory negative.  Cardiovascular: Cardiovascular negative.  GI: Gastrointestinal negative.  Musculoskeletal: Positive for leg pain.  Neurological: Neurological negative. Psychiatric: Psychiatric negative.        Objective:  Objective  Vitals:   10/18/22 0947  BP: 125/74  Pulse: 84  Resp: 14  Temp: 97.7 F (36.5 C)  SpO2: 96%     Physical Exam HENT:     Head: Normocephalic.     Nose: Nose normal.  Eyes:     Pupils: Pupils are equal, round, and reactive to light.  Neck:     Vascular: Carotid bruit present.  Cardiovascular:     Rate and Rhythm: Normal rate.     Pulses:          Femoral pulses are 2+ on the right side and 2+ on the left side.  Popliteal pulses are 0 on the right side and 2+ on the left side.       Dorsalis pedis pulses are 0 on the right side and 2+ on the left side.       Posterior tibial pulses are 0 on the right side.  Pulmonary:     Effort: Pulmonary effort is normal.  Abdominal:     General: Abdomen is flat.     Palpations: Abdomen is soft.  Skin:    General: Skin is warm.     Capillary Refill: Capillary refill takes more than 3 seconds.  Neurological:     General: No focal deficit present.     Mental Status: She is alert.  Psychiatric:        Mood and Affect: Mood normal.        Thought Content: Thought content normal.        Judgment: Judgment normal.     Data: ABI Findings:   +---------+------------------+-----+-------------------+--------+  Right   Rt Pressure (mmHg)IndexWaveform           Comment   +---------+------------------+-----+-------------------+--------+  Brachial 112                                                 +---------+------------------+-----+-------------------+--------+  PTA     79                0.66 dampened monophasic          +---------+------------------+-----+-------------------+--------+  DP      64                0.53 monophasic                   +---------+------------------+-----+-------------------+--------+  Great Toe13                0.11 Abnormal                     +---------+------------------+-----+-------------------+--------+   +---------+------------------+-----+---------+-------+  Left    Lt Pressure (mmHg)IndexWaveform Comment  +---------+------------------+-----+---------+-------+  Brachial 120                                      +---------+------------------+-----+---------+-------+  PTA     138               1.15 triphasic         +---------+------------------+-----+---------+-------+  DP      132               1.10 triphasic         +---------+------------------+-----+---------+-------+  Great Toe76                0.63 Abnormal          +---------+------------------+-----+---------+-------+   +-------+-----------+-----------+------------+------------+  ABI/TBIToday's ABIToday's TBIPrevious ABIPrevious TBI  +-------+-----------+-----------+------------+------------+  Right 0.66       0.11                                 +-------+-----------+-----------+------------+------------+  Left  1.15       0.63                                 +-------+-----------+-----------+------------+------------+  Summary:  Right: Resting right ankle-brachial index 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 The right  toe-brachial index is abnormal.   Left: Resting left ankle-brachial index is within normal range. The left  toe-brachial index is abnormal.      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  I discussed with her the need to begin the 81 mg aspirin along with the statin that she takes and I have recommended her for angiography from the left common femoral approach to evaluate her aorta and probable treatment of her right SFA popliteal segment with possible infrapopliteal disease as well although less likely given the lack of wounds.  Patient demonstrated good understanding would like some time to do more research prior to considering angiography which is certainly reasonable.  If she has more questions I am certainly happy to discuss with her over the 5 or again in the office she will otherwise call back to schedule aortogram with possible treatment of right SFA to popliteal segment.    Maeola Harman MD [facility]

## 2022-11-22 DIAGNOSIS — I10 Essential (primary) hypertension: Secondary | ICD-10-CM | POA: Diagnosis not present

## 2022-11-27 DIAGNOSIS — Z135 Encounter for screening for eye and ear disorders: Secondary | ICD-10-CM | POA: Diagnosis not present

## 2022-11-27 DIAGNOSIS — Z961 Presence of intraocular lens: Secondary | ICD-10-CM | POA: Diagnosis not present

## 2022-11-27 DIAGNOSIS — H52223 Regular astigmatism, bilateral: Secondary | ICD-10-CM | POA: Diagnosis not present

## 2022-12-04 DIAGNOSIS — Z87891 Personal history of nicotine dependence: Secondary | ICD-10-CM | POA: Diagnosis not present

## 2022-12-04 DIAGNOSIS — Z8249 Family history of ischemic heart disease and other diseases of the circulatory system: Secondary | ICD-10-CM | POA: Diagnosis not present

## 2022-12-04 DIAGNOSIS — E785 Hyperlipidemia, unspecified: Secondary | ICD-10-CM | POA: Diagnosis not present

## 2022-12-04 DIAGNOSIS — I10 Essential (primary) hypertension: Secondary | ICD-10-CM | POA: Diagnosis not present

## 2022-12-04 DIAGNOSIS — Z803 Family history of malignant neoplasm of breast: Secondary | ICD-10-CM | POA: Diagnosis not present

## 2022-12-04 DIAGNOSIS — E890 Postprocedural hypothyroidism: Secondary | ICD-10-CM | POA: Diagnosis not present

## 2022-12-04 DIAGNOSIS — Z008 Encounter for other general examination: Secondary | ICD-10-CM | POA: Diagnosis not present

## 2022-12-04 DIAGNOSIS — J449 Chronic obstructive pulmonary disease, unspecified: Secondary | ICD-10-CM | POA: Diagnosis not present

## 2022-12-04 DIAGNOSIS — Z825 Family history of asthma and other chronic lower respiratory diseases: Secondary | ICD-10-CM | POA: Diagnosis not present

## 2022-12-04 DIAGNOSIS — Z823 Family history of stroke: Secondary | ICD-10-CM | POA: Diagnosis not present

## 2022-12-04 DIAGNOSIS — M1990 Unspecified osteoarthritis, unspecified site: Secondary | ICD-10-CM | POA: Diagnosis not present

## 2022-12-04 DIAGNOSIS — Z880 Allergy status to penicillin: Secondary | ICD-10-CM | POA: Diagnosis not present

## 2022-12-04 DIAGNOSIS — I739 Peripheral vascular disease, unspecified: Secondary | ICD-10-CM | POA: Diagnosis not present

## 2022-12-06 DIAGNOSIS — I10 Essential (primary) hypertension: Secondary | ICD-10-CM | POA: Diagnosis not present

## 2022-12-06 DIAGNOSIS — E890 Postprocedural hypothyroidism: Secondary | ICD-10-CM | POA: Diagnosis not present

## 2022-12-11 ENCOUNTER — Other Ambulatory Visit: Payer: Self-pay

## 2022-12-11 DIAGNOSIS — R0600 Dyspnea, unspecified: Secondary | ICD-10-CM

## 2022-12-11 DIAGNOSIS — R053 Chronic cough: Secondary | ICD-10-CM

## 2022-12-12 ENCOUNTER — Other Ambulatory Visit (HOSPITAL_COMMUNITY): Payer: Self-pay

## 2022-12-12 ENCOUNTER — Encounter: Payer: Self-pay | Admitting: Primary Care

## 2022-12-12 ENCOUNTER — Telehealth: Payer: Self-pay | Admitting: Primary Care

## 2022-12-12 ENCOUNTER — Ambulatory Visit (INDEPENDENT_AMBULATORY_CARE_PROVIDER_SITE_OTHER): Payer: Medicare HMO | Admitting: Student

## 2022-12-12 ENCOUNTER — Ambulatory Visit: Payer: Medicare HMO | Admitting: Primary Care

## 2022-12-12 VITALS — BP 122/60 | HR 65 | Temp 98.1°F | Ht 61.5 in | Wt 126.4 lb

## 2022-12-12 DIAGNOSIS — R0600 Dyspnea, unspecified: Secondary | ICD-10-CM | POA: Diagnosis not present

## 2022-12-12 DIAGNOSIS — E039 Hypothyroidism, unspecified: Secondary | ICD-10-CM | POA: Insufficient documentation

## 2022-12-12 DIAGNOSIS — I10 Essential (primary) hypertension: Secondary | ICD-10-CM | POA: Insufficient documentation

## 2022-12-12 DIAGNOSIS — J4489 Other specified chronic obstructive pulmonary disease: Secondary | ICD-10-CM | POA: Diagnosis not present

## 2022-12-12 DIAGNOSIS — G5600 Carpal tunnel syndrome, unspecified upper limb: Secondary | ICD-10-CM | POA: Insufficient documentation

## 2022-12-12 DIAGNOSIS — R053 Chronic cough: Secondary | ICD-10-CM | POA: Diagnosis not present

## 2022-12-12 DIAGNOSIS — K219 Gastro-esophageal reflux disease without esophagitis: Secondary | ICD-10-CM | POA: Insufficient documentation

## 2022-12-12 LAB — PULMONARY FUNCTION TEST
DL/VA % pred: 86 %
DL/VA: 3.61 ml/min/mmHg/L
DLCO cor % pred: 76 %
DLCO cor: 13.38 ml/min/mmHg
DLCO unc % pred: 76 %
DLCO unc: 13.38 ml/min/mmHg
FEF 25-75 Post: 0.75 L/sec
FEF 25-75 Pre: 0.47 L/sec
FEF2575-%Change-Post: 59 %
FEF2575-%Pred-Post: 49 %
FEF2575-%Pred-Pre: 31 %
FEV1-%Change-Post: 20 %
FEV1-%Pred-Post: 59 %
FEV1-%Pred-Pre: 49 %
FEV1-Post: 1.12 L
FEV1-Pre: 0.93 L
FEV1FVC-%Change-Post: 5 %
FEV1FVC-%Pred-Pre: 75 %
FEV6-%Change-Post: 16 %
FEV6-%Pred-Post: 77 %
FEV6-%Pred-Pre: 66 %
FEV6-Post: 1.85 L
FEV6-Pre: 1.58 L
FEV6FVC-%Change-Post: 0 %
FEV6FVC-%Pred-Post: 104 %
FEV6FVC-%Pred-Pre: 104 %
FVC-%Change-Post: 13 %
FVC-%Pred-Post: 74 %
FVC-%Pred-Pre: 65 %
FVC-Post: 1.86 L
FVC-Pre: 1.64 L
Post FEV1/FVC ratio: 60 %
Post FEV6/FVC ratio: 99 %
Pre FEV1/FVC ratio: 57 %
Pre FEV6/FVC Ratio: 99 %
RV % pred: 199 %
RV: 4.31 L
TLC % pred: 130 %
TLC: 6.12 L

## 2022-12-12 NOTE — Assessment & Plan Note (Addendum)
-   Former smoker quit in 1989, 20 pack year smoking hx. She experiences mild dyspnea/wheezing with exertion. Not limited by her breathing. No associated Cough.  CAT score 5.  Some perceived benefit from trial [employer].  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 Since she is minimally symptomatic, recommend deescalating therapy to ICS/LABA. We will check with pharmacy to see what inhaler is formulary on her plan.  For now she will continue Breztri until she finishes current inhaler.  Needs alpha-1 testing today. Advise patient take Mucinex 600 mg twice daily as needed for chest congestion/cough. Follow-up in 6 months or sooner if needed.

## 2022-12-12 NOTE — Progress Notes (Signed)
@Patient  ID: Shannon Jenkins, female    DOB: June 05, 1946, 77 y.o.   MRN: [id number]  Chief Complaint  Patient presents with   Follow-up    Had PFT today-doing good    Referring provider: Aliene Beams, MD  HPI: 42 year, former smoker. PMH significant for hypertension, GERD and hypothyroidism.   12/12/2022 Patient presents today for a follow-up with pulmonary function testing. She was seen by Dr. Thora Lance for initial consult in March for dyspnea. She was started on Breztri 2 puffs twice daily and ordered for pulmonary function testing.  Former smoker quit in 1989, 20 pack year smoking hx. She is minimally symptomatic. Is not particularly effected by her breathing. She only experiences shortness of breath symptoms when walking really fast or when she gets an upper respiratory infection. Her daughter did noticed her breathing was better. Wheezing improved while taking Breztri. She is not limited doing any activities due to her breathing. CAT 5   Pulmonary function testing  12/12/2022 FVC 1.86 (74%), FEV1 1.12 (59%), ratio 60, TLC 130%, DLCO 13.36 (76%) Impression: Moderate obstruction with positive bronchodilator response and mild diffusion defect   Allergies  Allergen Reactions   Lisinopril     Other reaction(s): cough   Penicillins Hives and Rash    Immunization History  Administered Date(s) Administered   PFIZER Comirnaty(Gray Top)Covid-19 Tri-Sucrose Vaccine 08/03/2019, 08/26/2019, 04/06/2020    No past medical history on file.  Tobacco History: Social History   Tobacco Use  Smoking Status Former   Types: Cigarettes   Quit date: 1989   Years since quitting: 35.4  Smokeless Tobacco Not on file   Counseling given: Not Answered   Outpatient Medications Prior to Visit  Medication Sig Dispense Refill   Budeson-Glycopyrrol-Formoterol (BREZTRI AEROSPHERE) 160-9-4.8 MCG/ACT AERO Inhale 2 puffs into the lungs in the morning and at bedtime. (Patient not taking: Reported on  10/18/2022) 10.7 g 4   cyclobenzaprine (FLEXERIL) 10 MG tablet Take 1 tablet (10 mg total) by mouth at bedtime. 30 tablet 0   gabapentin (NEURONTIN) 100 MG capsule Take 1 capsule (100 mg total) by mouth at bedtime. 60 capsule 1   mupirocin ointment (BACTROBAN) 2 % Apply 1 Application topically 2 (two) times daily. (Patient not taking: Reported on 09/11/2022) 22 g 1   Vitamin D, Ergocalciferol, (DRISDOL) 1.25 MG (50000 UNIT) CAPS capsule Take 50,000 Units by mouth once a week.     No facility-administered medications prior to visit.   Review of Systems  Review of Systems  Constitutional: Negative.   HENT: Negative.    Respiratory:  Negative for chest tightness, shortness of breath and wheezing.      Physical Exam  BP 122/60 (BP Location: Left Arm, Cuff Size: Normal)   Pulse 65   Temp 98.1 F (36.7 C) (Temporal)   Ht 5' 1.5" (1.562 m)   Wt 126 lb 6.4 oz (57.3 kg)   SpO2 99%   BMI 23.50 kg/m  Physical Exam Constitutional:      Appearance: Normal appearance.  HENT:     Head: Normocephalic and atraumatic.     Mouth/Throat:     Mouth: Mucous membranes are moist.     Pharynx: Oropharynx is clear.  Cardiovascular:     Rate and Rhythm: Normal rate and regular rhythm.  Pulmonary:     Effort: Pulmonary effort is normal.     Breath sounds: Normal breath sounds. No wheezing, rhonchi or rales.  Skin:    General: Skin is warm and dry.  Neurological:     General: No focal deficit present.     Mental Status: She is alert and oriented to person, place, and time. Mental status is at baseline.  Psychiatric:        Mood and Affect: Mood normal.        Behavior: Behavior normal.        Thought Content: Thought content normal.        Judgment: Judgment normal.      Lab Results:  CBC No results found for: "WBC", "RBC", "HGB", "HCT", "PLT", "MCV", "MCH", "MCHC", "RDW", "LYMPHSABS", "MONOABS", "EOSABS", "BASOSABS"  BMET No results found for: "NA", "K", "CL", "CO2", "GLUCOSE", "BUN",  "CREATININE", "CALCIUM", "GFRNONAA", "GFRAA"  BNP No results found for: "BNP"  ProBNP No results found for: "PROBNP"  Imaging: No results found.   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 She experiences mild dyspnea/wheezing with exertion. Not limited by her breathing. No associated Cough.  CAT score 5.  Some perceived benefit from trial [employer].  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 Since she is minimally symptomatic, recommend deescalating therapy to ICS/LABA. We will check with pharmacy to see what inhaler is formulary on her plan.  For now she will continue Breztri until she finishes current inhaler.  Needs alpha-1 testing today. Advise patient take Mucinex 600 mg twice daily as needed for chest congestion/cough. Follow-up in 6 months or sooner if needed.     Glenford Bayley, NP 12/12/2022

## 2022-12-12 NOTE — Patient Instructions (Signed)
Full PFT performed today. °

## 2022-12-12 NOTE — Telephone Encounter (Signed)
What ICS/LABA is formulary or cheapest on plan?

## 2022-12-12 NOTE — Progress Notes (Signed)
Full PFT performed today. °

## 2022-12-12 NOTE — Patient Instructions (Addendum)
Pulmonary function testing today showed moderate obstructive lung disease with reversibility consistent with COPD/asthma overlap  Since you are minimally symptomatic recommend de-escalating therapy to something like Symbicort or Advair.  I would check with your pharmacy to see what is formulary on plan.  For now continue Breztri 2 puffs twice daily.  Use albuterol 2 puffs every 4-6 hours as needed for breakthrough shortness of breath or wheezing. Take Mucinex 600 mg twice daily to loosen congestion.  Notify office if you develop increased cough or shortness of breath.    Orders: Labs today (alpha-1 phenotype)   Follow-up: 6 months or sooner if needed with either Dr. Celine Mans or Dr. Francine Graven (Former [facility] patient-30 MIN)  Chronic Obstructive Pulmonary Disease  Chronic obstructive pulmonary disease (COPD) is a long-term (chronic) lung problem. When you have COPD, it is hard for air to get in and out of your lungs. Usually the condition gets worse over time, and your lungs will never return to normal. There are things you can do to keep yourself as healthy as possible. What are the causes? Smoking. This is the most common cause. Certain genes passed from parent to child (inherited). What increases the risk? Being exposed to secondhand smoke from cigarettes, pipes, or cigars. Being exposed to chemicals and other irritants, such as fumes and dust in the work environment. Having chronic lung conditions or infections. What are the signs or symptoms? Shortness of breath, especially during physical activity. A long-term cough with a large amount of thick mucus. Sometimes, the cough may not have any mucus (dry cough). Wheezing. Breathing quickly. Skin that looks gray or blue, especially in the fingers, toes, or lips. Feeling tired (fatigue). Weight loss. Chest tightness. Having infections often. Episodes when breathing symptoms become much worse (exacerbations). At the later stages of this  disease, you may have swelling in the ankles, feet, or legs. How is this treated? Taking medicines. Quitting smoking, if you smoke. Rehabilitation. This includes steps to make your body work better. It may involve a team of specialists. Doing exercises. Making changes to your diet. Using oxygen. Lung surgery. Lung transplant. Comfort measures (palliative care). Follow these instructions at home: Medicines Take over-the-counter and prescription medicines only as told by your doctor. Talk to your doctor before taking any cough or allergy medicines. You may need to avoid medicines that cause your lungs to be dry. Lifestyle If you smoke, stop smoking. Smoking makes the problem worse. Do not smoke or use any products that contain nicotine or tobacco. If you need help quitting, ask your doctor. Avoid being around things that make your breathing worse. This may include smoke, chemicals, and fumes. Stay active, but remember to rest as well. Learn and use tips on how to manage stress and control your breathing. Make sure you get enough sleep. Most adults need at least 7 hours of sleep every night. Eat healthy foods. Eat smaller meals more often. Rest before meals. Controlled breathing Learn and use tips on how to control your breathing as told by your doctor. Try: Breathing in (inhaling) through your nose for 1 second. Then, pucker your lips and breath out (exhale) through your lips for 2 seconds. Putting one hand on your belly (abdomen). Breathe in slowly through your nose for 1 second. Your hand on your belly should move out. Pucker your lips and breathe out slowly through your lips. Your hand on your belly should move in as you breathe out.  Controlled coughing Learn and use controlled coughing to clear  mucus from your lungs. Follow these steps: Lean your head a little forward. Breathe in deeply. Try to hold your breath for 3 seconds. Keep your mouth slightly open while coughing 2  times. Spit any mucus out into a tissue. Rest and do the steps again 1 or 2 times as needed. General instructions Make sure you get all the shots (vaccines) that your doctor recommends. Ask your doctor about a flu shot and a pneumonia shot. Use oxygen therapy and pulmonary rehabilitation if told by your doctor. If you need home oxygen therapy, ask your doctor if you should buy a tool to measure your oxygen level (oximeter). Make a COPD action plan with your doctor. This helps you to know what to do if you feel worse than usual. Manage any other conditions you have as told by your doctor. Avoid going outside when it is very hot, cold, or humid. Avoid people who have a sickness you can catch (contagious). Keep all follow-up visits. Contact a doctor if: You cough up more mucus than usual. There is a change in the color or thickness of the mucus. It is harder to breathe than usual. Your breathing is faster than usual. You have trouble sleeping. You need to use your medicines more often than usual. You have trouble doing your normal activities such as getting dressed or walking around the house. Get help right away if: You have shortness of breath while resting. You have shortness of breath that stops you from: Being able to talk. Doing normal activities. Your chest hurts for longer than 5 minutes. Your skin color is more blue than usual. Your pulse oximeter shows that you have low oxygen for longer than 5 minutes. You have a fever. You feel too tired to breathe normally. These symptoms may represent a serious problem that is an emergency. Do not wait to see if the symptoms will go away. Get medical help right away. Call your local emergency services (911 in the [place] Do not drive yourself to the hospital. Summary Chronic obstructive pulmonary disease (COPD) is a long-term lung problem. The way your lungs work will never return to normal. Usually the condition gets worse over time. There  are things you can do to keep yourself as healthy as possible. Take over-the-counter and prescription medicines only as told by your doctor. If you smoke, stop. Smoking makes the problem worse. This information is not intended to replace advice given to you by your health care provider. Make sure you discuss any questions you have with your health care provider. Document Revised: 04/24/2020 Document Reviewed: 04/25/2020 Elsevier Patient Education  2024 [employer].

## 2022-12-12 NOTE — Telephone Encounter (Signed)
Test claims return the following for ICS+LABA:  Advair Diskus (Brand) Requires [place]  (Generic) $10.00 Advair HFA (Brand) $100.00  (Generic) Requires PA [place]  $10.00 Breo  $47.00 Dulera  $100.00 Symbicort (Brand) Requires [place] (Generic) $47.00

## 2022-12-19 ENCOUNTER — Ambulatory Visit
Admission: RE | Admit: 2022-12-19 | Discharge: 2022-12-19 | Disposition: A | Discharge status: Home / Self Care | Payer: Medicare HMO | Source: Ambulatory Visit | Attending: Family Medicine | Admitting: Family Medicine

## 2022-12-19 DIAGNOSIS — Z1231 Encounter for screening mammogram for malignant neoplasm of breast: Secondary | ICD-10-CM | POA: Diagnosis not present

## 2022-12-19 LAB — ALPHA-1 ANTITRYPSIN PHENOTYPE: A-1 Antitrypsin, Ser: 112 mg/dL (ref 83–199)

## 2023-01-06 DIAGNOSIS — M7061 Trochanteric bursitis, right hip: Secondary | ICD-10-CM | POA: Diagnosis not present

## 2023-01-06 DIAGNOSIS — M7062 Trochanteric bursitis, left hip: Secondary | ICD-10-CM | POA: Diagnosis not present

## 2023-01-24 IMAGING — MG MM DIGITAL SCREENING BILAT W/ TOMO AND CAD
6 of 12 series · 6 of 36 positions shown · non-contrast
Comparison: Previous exam(s).

CLINICAL DATA: Screening.

EXAM:
DIGITAL SCREENING BILATERAL MAMMOGRAM WITH TOMOSYNTHESIS AND CAD
TECHNIQUE: Bilateral screening digital craniocaudal and mediolateral oblique
mammograms were obtained. Bilateral screening digital breast
tomosynthesis was performed. The images were evaluated with
computer-aided detection.

[R CC synth-2D]
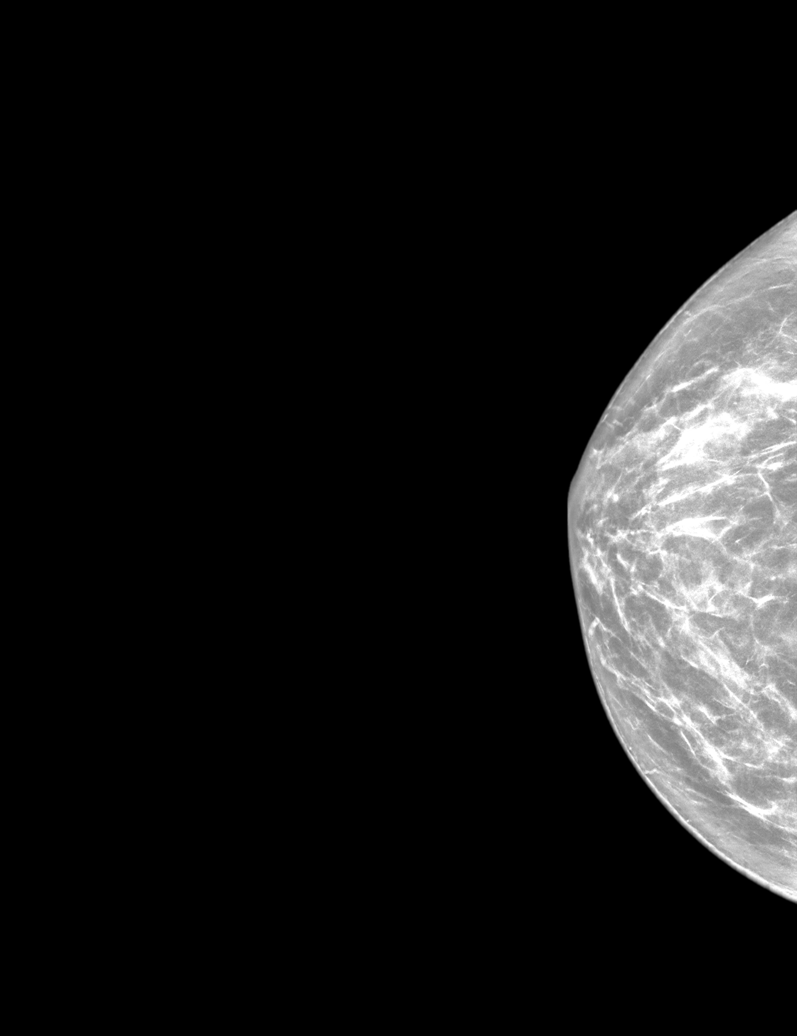

[L XCCL synth-2D]
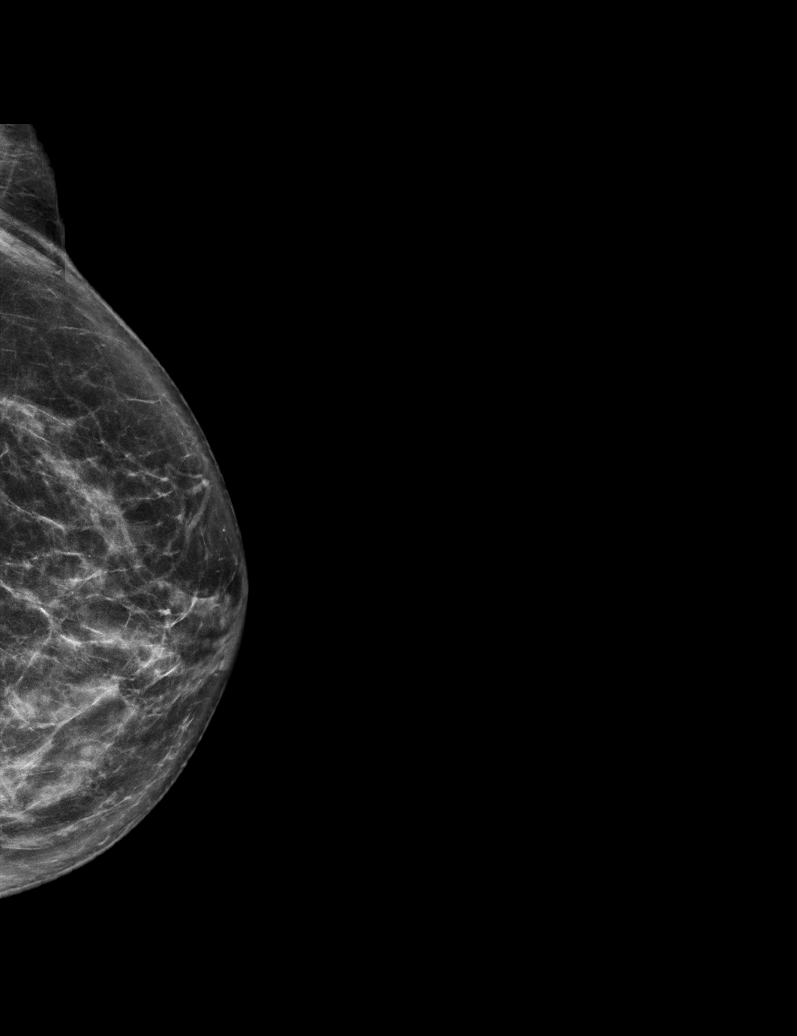

[R XCCL synth-2D]
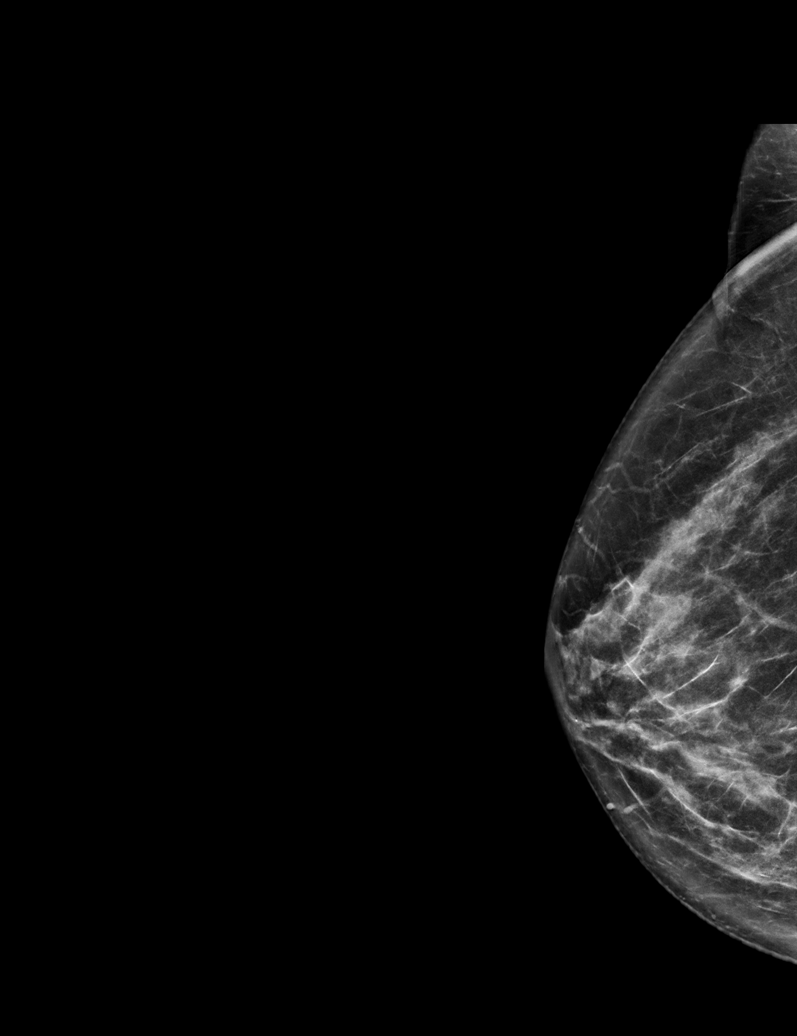

[R MLO synth-2D]
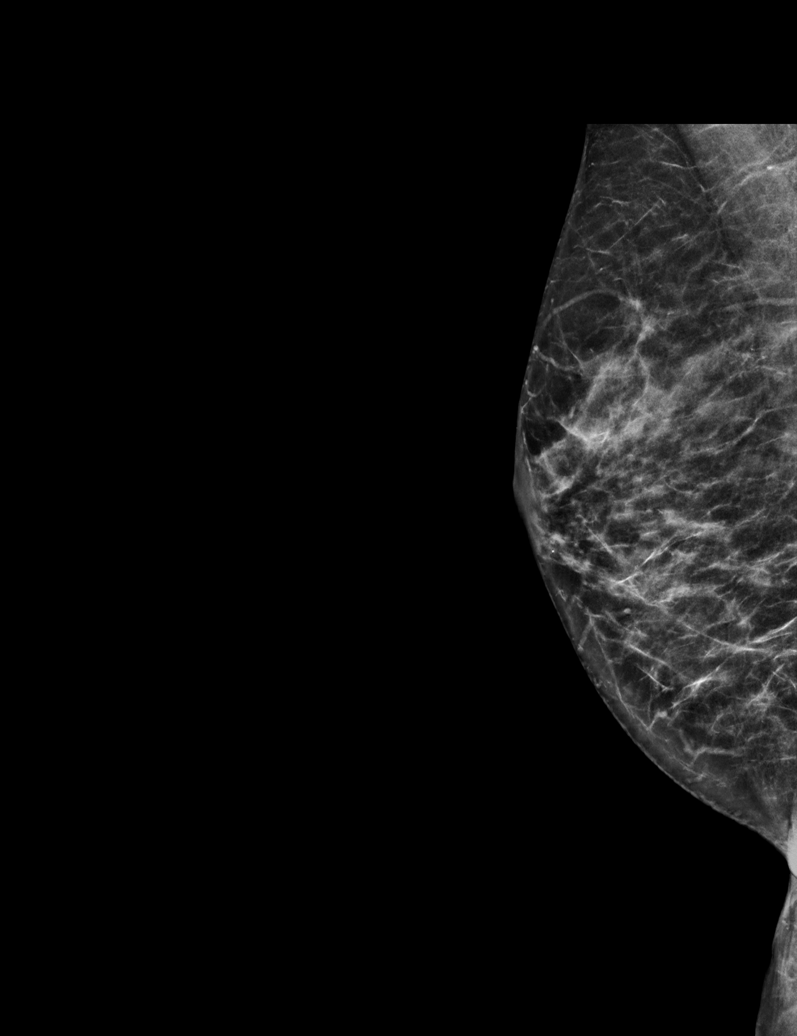

[L CC synth-2D]
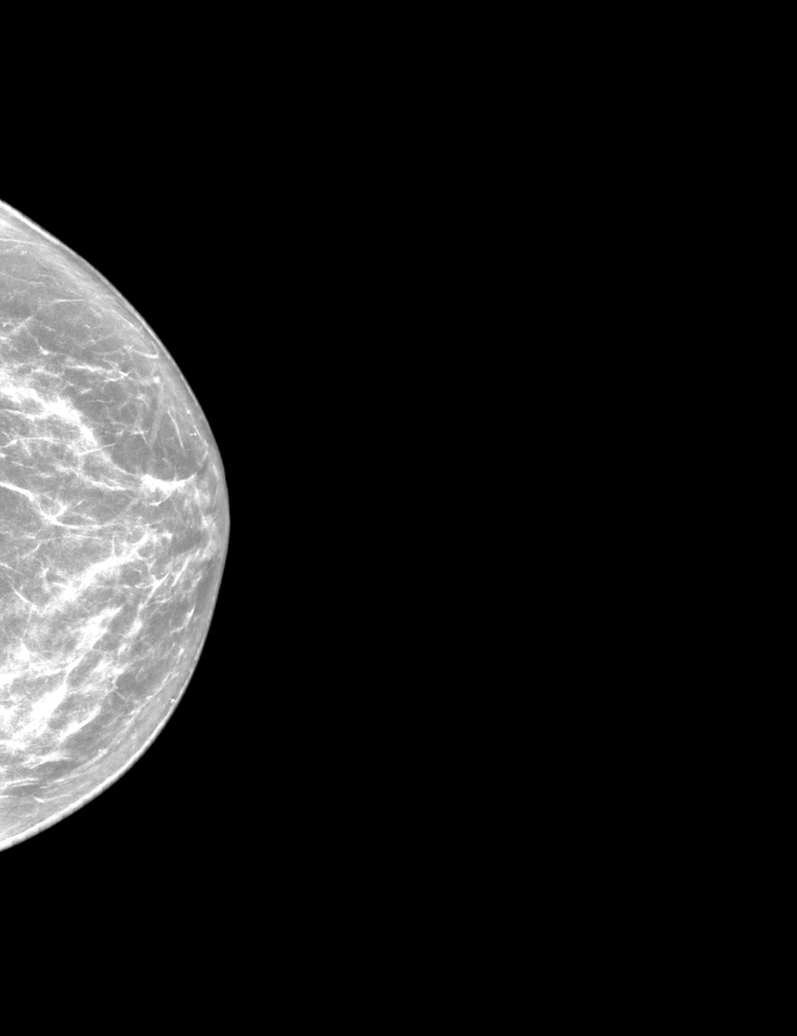

[L MLO synth-2D]
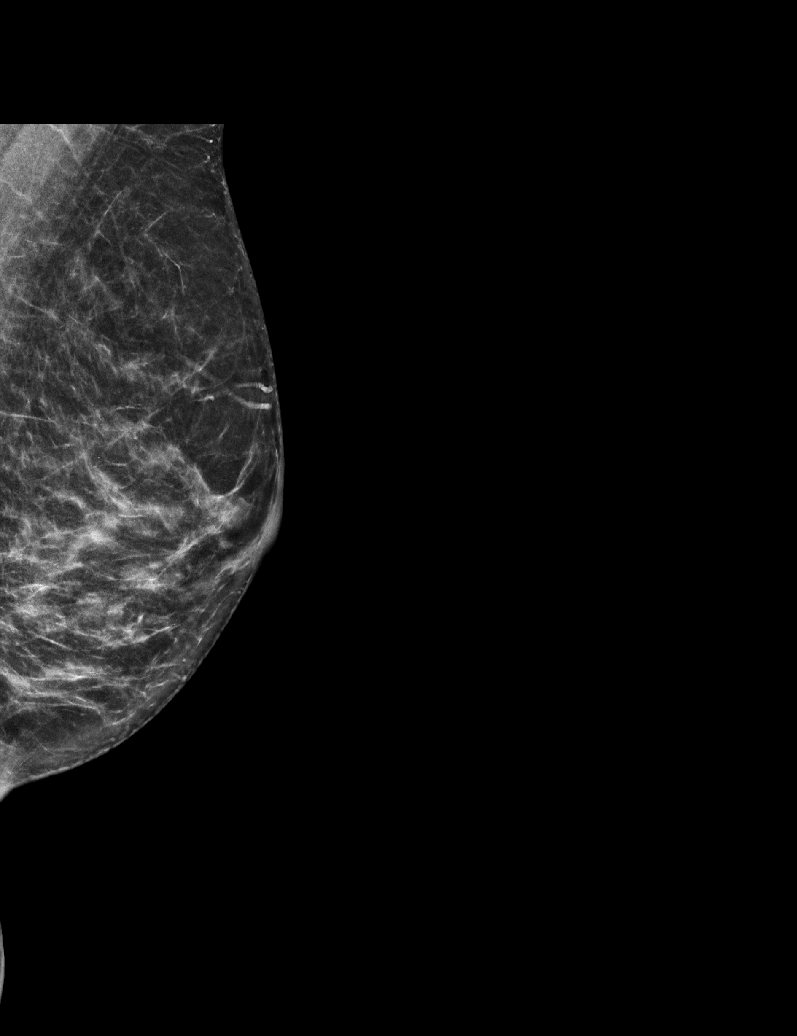

[6 of 36 positions shown; findings below may reference images not displayed]

ACR Breast Density Category c: The breast tissue is heterogeneously
dense, which may obscure small masses.
FINDINGS: There are no findings suspicious for malignancy.
IMPRESSION: No mammographic evidence of malignancy. A result letter of this
screening mammogram will be mailed directly to the patient.

RECOMMENDATION:
Screening mammogram in one year. (Code:[id number]-W-BC3)

BI-RADS CATEGORY  1: Negative.

## 2023-02-04 DIAGNOSIS — E890 Postprocedural hypothyroidism: Secondary | ICD-10-CM | POA: Diagnosis not present

## 2023-02-11 NOTE — Telephone Encounter (Signed)
Can you call patient and ask what inhaler she is currently using. She was on Breztri but we discuss de-escalating therapy since she was doing well. Preferred inhaler on her plan is Advair which would be 10 dollars. If she would like to try this let me know and I can send in RX

## 2023-02-12 NOTE — Telephone Encounter (Signed)
Perfect thanks

## 2023-02-12 NOTE — Telephone Encounter (Signed)
Called patient.  Patient picked up rx for [employer] yesterday with out of pocket cost of $94.  She is only using this as 2 puffs every morning and is doing well.    Patient would like to finish the Breztri first.  It should last her 60 days (120 doses).  Then she would like to try the Advair for $10.  Patient will send [place] a MyChart message when she is out of [place].

## 2023-02-24 DIAGNOSIS — E890 Postprocedural hypothyroidism: Secondary | ICD-10-CM | POA: Diagnosis not present

## 2023-04-14 DIAGNOSIS — I10 Essential (primary) hypertension: Secondary | ICD-10-CM | POA: Diagnosis not present

## 2023-04-14 DIAGNOSIS — R946 Abnormal results of thyroid function studies: Secondary | ICD-10-CM | POA: Diagnosis not present

## 2023-04-15 DIAGNOSIS — E890 Postprocedural hypothyroidism: Secondary | ICD-10-CM | POA: Diagnosis not present

## 2023-04-15 DIAGNOSIS — I739 Peripheral vascular disease, unspecified: Secondary | ICD-10-CM | POA: Diagnosis not present

## 2023-04-15 DIAGNOSIS — I10 Essential (primary) hypertension: Secondary | ICD-10-CM | POA: Diagnosis not present

## 2023-04-15 DIAGNOSIS — J4489 Other specified chronic obstructive pulmonary disease: Secondary | ICD-10-CM | POA: Diagnosis not present

## 2023-05-16 DIAGNOSIS — H60391 Other infective otitis externa, right ear: Secondary | ICD-10-CM | POA: Diagnosis not present

## 2023-05-16 DIAGNOSIS — S00411A Abrasion of right ear, initial encounter: Secondary | ICD-10-CM | POA: Diagnosis not present

## 2023-05-23 DIAGNOSIS — M25551 Pain in right hip: Secondary | ICD-10-CM | POA: Diagnosis not present

## 2023-06-01 ENCOUNTER — Emergency Department (HOSPITAL_BASED_OUTPATIENT_CLINIC_OR_DEPARTMENT_OTHER): Payer: Medicare HMO

## 2023-06-01 ENCOUNTER — Encounter (HOSPITAL_BASED_OUTPATIENT_CLINIC_OR_DEPARTMENT_OTHER): Payer: Self-pay | Admitting: Emergency Medicine

## 2023-06-01 ENCOUNTER — Other Ambulatory Visit: Payer: Self-pay

## 2023-06-01 ENCOUNTER — Emergency Department (HOSPITAL_BASED_OUTPATIENT_CLINIC_OR_DEPARTMENT_OTHER)
Admission: EM | Admit: 2023-06-01 | Discharge: 2023-06-01 | Disposition: A | Discharge status: Home / Self Care | Payer: Medicare HMO | Attending: Emergency Medicine | Admitting: Emergency Medicine

## 2023-06-01 DIAGNOSIS — M5020 Other cervical disc displacement, unspecified cervical region: Secondary | ICD-10-CM | POA: Diagnosis not present

## 2023-06-01 DIAGNOSIS — J449 Chronic obstructive pulmonary disease, unspecified: Secondary | ICD-10-CM | POA: Diagnosis not present

## 2023-06-01 DIAGNOSIS — M47812 Spondylosis without myelopathy or radiculopathy, cervical region: Secondary | ICD-10-CM | POA: Diagnosis not present

## 2023-06-01 DIAGNOSIS — I10 Essential (primary) hypertension: Secondary | ICD-10-CM | POA: Insufficient documentation

## 2023-06-01 DIAGNOSIS — Z7951 Long term (current) use of inhaled steroids: Secondary | ICD-10-CM | POA: Diagnosis not present

## 2023-06-01 DIAGNOSIS — S0181XA Laceration without foreign body of other part of head, initial encounter: Secondary | ICD-10-CM | POA: Diagnosis not present

## 2023-06-01 DIAGNOSIS — S0990XA Unspecified injury of head, initial encounter: Secondary | ICD-10-CM | POA: Diagnosis not present

## 2023-06-01 DIAGNOSIS — W01198A Fall on same level from slipping, tripping and stumbling with subsequent striking against other object, initial encounter: Secondary | ICD-10-CM | POA: Diagnosis not present

## 2023-06-01 DIAGNOSIS — E039 Hypothyroidism, unspecified: Secondary | ICD-10-CM | POA: Diagnosis not present

## 2023-06-01 DIAGNOSIS — W19XXXA Unspecified fall, initial encounter: Secondary | ICD-10-CM

## 2023-06-01 DIAGNOSIS — M4802 Spinal stenosis, cervical region: Secondary | ICD-10-CM | POA: Diagnosis not present

## 2023-06-01 HISTORY — DX: Essential (primary) hypertension: I10

## 2023-06-01 HISTORY — DX: Disorder of thyroid, unspecified: E07.9

## 2023-06-01 MED ORDER — TETANUS-DIPHTH-ACELL PERTUSSIS 5-2.5-18.5 LF-MCG/0.5 IM SUSY
0.5000 mL | PREFILLED_SYRINGE | Freq: Once | INTRAMUSCULAR | Status: DC
  Filled 2023-06-01: qty 0.5

## 2023-06-01 MED ORDER — LIDOCAINE HCL (PF) 1 % IJ SOLN
5.0000 mL | Freq: Once | INTRAMUSCULAR | Status: AC
  Administered 2023-06-01: 5 mL
  Filled 2023-06-01: qty 5

## 2023-06-01 NOTE — ED Notes (Signed)
ED Provider at bedside. 

## 2023-06-01 NOTE — ED Triage Notes (Signed)
Pt tripped and fell, hitting LT side forehead on brick step; denies LOC; takes baby ASA, no other thinners

## 2023-06-01 NOTE — ED Provider Notes (Signed)
[place] [facility] Provider Note   CSN: [id number] Arrival date & time: 06/01/23  1416     History  Chief Complaint  Patient presents with   Shannon Jenkins is a 77 y.o. female with a past medical history significant for GERD, hypertension, COPD, and hypothyroidism who presents to the ED after a mechanical fall.  Patient tripped and fell on a brick step hitting the left side of her forehead.  Patient sustained a laceration to left side of forehead.  Unsure when her last tetanus shot was.  No LOC.  On ASA 81 mg however, no other blood thinners.  Denies any neck pain.  Took Aleve prior to arrival with improvement in headache.  No nausea or vomiting.  Denies visual changes and speech changes.  No numbness/tingling.  No other injuries.  Daughter at bedside.  History obtained from patient and past medical records. No interpreter used during encounter.       Home Medications Prior to Admission medications   Medication Sig Start Date End Date Taking? Authorizing Provider  Budeson-Glycopyrrol-Formoterol (BREZTRI AEROSPHERE) 160-9-4.8 MCG/ACT AERO Inhale 2 puffs into the lungs in the morning and at bedtime. Patient not taking: Reported on 10/18/2022 09/23/22   Omar Person, MD  cyclobenzaprine (FLEXERIL) 10 MG tablet Take 1 tablet (10 mg total) by mouth at bedtime. 09/11/22   Felecia Shelling, DPM  gabapentin (NEURONTIN) 100 MG capsule Take 1 capsule (100 mg total) by mouth at bedtime. 08/14/22   Felecia Shelling, DPM  mupirocin ointment (BACTROBAN) 2 % Apply 1 Application topically 2 (two) times daily. Patient not taking: Reported on 09/11/2022 05/13/22   Felecia Shelling, DPM  Vitamin D, Ergocalciferol, (DRISDOL) 1.25 MG (50000 UNIT) CAPS capsule Take 50,000 Units by mouth once a week. 02/22/22   [provider]      Allergies    Lisinopril and Penicillins    Review of Systems   Review of Systems  Skin:  Positive for wound.   Neurological:  Positive for headaches.    Physical Exam Updated Vital Signs BP (!) 163/73 (BP Location: Right Arm)   Pulse 60   Temp 98 F (36.7 C) (Oral)   Resp 18   Ht 5\' 1"  (1.549 m)   Wt 58.1 kg   SpO2 98%   BMI 24.19 kg/m  Physical Exam Vitals and nursing note reviewed.  Constitutional:      General: She is not in acute distress.    Appearance: She is not ill-appearing.  HENT:     Head: Normocephalic.     Comments: 2cm laceration to left side of forehead Eyes:     Pupils: Pupils are equal, round, and reactive to light.  Cardiovascular:     Rate and Rhythm: Normal rate and regular rhythm.     Pulses: Normal pulses.     Heart sounds: Normal heart sounds. No murmur heard.    No friction rub. No gallop.  Pulmonary:     Effort: Pulmonary effort is normal.     Breath sounds: Normal breath sounds.  Abdominal:     General: Abdomen is flat. There is no distension.     Palpations: Abdomen is soft.     Tenderness: There is no abdominal tenderness. There is no guarding or rebound.  Musculoskeletal:        General: Normal range of motion.     Cervical back: Neck supple.  Skin:    General: Skin  is warm and dry.  Neurological:     General: No focal deficit present.     Mental Status: She is alert.     Comments: Speech is clear, able to follow commands CN III-XII intact Normal strength in upper and lower extremities bilaterally including dorsiflexion and plantar flexion, strong and equal grip strength Sensation grossly intact throughout Moves extremities without ataxia, coordination intact No pronator drift Ambulates without difficulty  Psychiatric:        Mood and Affect: Mood normal.        Behavior: Behavior normal.     ED Results / Procedures / Treatments   Labs (all labs ordered are listed, but only abnormal results are displayed) Labs Reviewed - No data to display  EKG None  Radiology CT Head Wo Contrast  Result Date: 06/01/2023 CLINICAL DATA:   Provided history: Head trauma, moderate/severe. Neck trauma. Additional history provided: Fall (hitting forehead on brick step). EXAM: CT HEAD WITHOUT CONTRAST CT CERVICAL SPINE WITHOUT CONTRAST TECHNIQUE: Multidetector CT imaging of the head and cervical spine was performed following the standard protocol without intravenous contrast. Multiplanar CT image reconstructions of the cervical spine were also generated. RADIATION DOSE REDUCTION: This exam was performed according to the departmental dose-optimization program which includes automated exposure control, adjustment of the mA and/or kV according to patient size and/or use of iterative reconstruction technique. COMPARISON:  None. FINDINGS: CT HEAD FINDINGS Brain: Mild generalized cerebral atrophy. Patchy and ill-defined hypoattenuation within the cerebral white matter, nonspecific but compatible with mild chronic small vessel ischemic disease. There is no acute intracranial hemorrhage. No demarcated cortical infarct. No extra-axial fluid collection. No evidence of an intracranial mass. No midline shift. Vascular: No hyperdense vessel.  Atherosclerotic calcifications. Skull: No calvarial fracture or aggressive osseous lesion. Sinuses/Orbits: No mass or acute finding within the imaged orbits. No significant paranasal sinus disease at the imaged levels. CT CERVICAL SPINE FINDINGS Alignment: Nonspecific reversal of the expected cervical lordosis. 2 mm C2-C3 grade 1 anterolisthesis. Skull base and vertebrae: The basion-dental and atlanto-dental intervals are maintained.No evidence of acute fracture to the cervical spine. 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 Soft tissues and spinal canal: No prevertebral fluid or swelling. No visible canal hematoma. Disc levels: Cervical spondylosis with multilevel disc space narrowing, disc bulges/central disc protrusions, posterior disc osteophyte complexes, uncovertebral hypertrophy and facet arthrosis. Disc space narrowing is  advanced at C3-C4, C4-C5, C5-C6 and C6-C7. No appreciable high-grade spinal canal stenosis. Multilevel bony neural foraminal narrowing. Upper chest: No consolidation within the imaged lung apices. No visible pneumothorax. IMPRESSION: CT head: 1. No evidence of an acute intracranial abnormality. 2. Mild chronic small vessel ischemic changes within the cerebral white matter. 3. Mild generalized cerebral atrophy. CT cervical spine: 1. No evidence of an acute cervical spine fracture. 2. Nonspecific reversal of the expected cervical lordosis. 3. 2 mm C2-C3 grade 1 anterolisthesis. 4. Cervical spondylosis as described. 5. 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 Electronically Signed   By: Jackey Loge D.O.   On: 06/01/2023 15:50   CT Cervical Spine Wo Contrast  Result Date: 06/01/2023 CLINICAL DATA:  Provided history: Head trauma, moderate/severe. Neck trauma. Additional history provided: Fall (hitting forehead on brick step). EXAM: CT HEAD WITHOUT CONTRAST CT CERVICAL SPINE WITHOUT CONTRAST TECHNIQUE: Multidetector CT imaging of the head and cervical spine was performed following the standard protocol without intravenous contrast. Multiplanar CT image reconstructions of the cervical spine were also generated. RADIATION DOSE REDUCTION: This exam was performed according to the  departmental dose-optimization program which includes automated exposure control, adjustment of the mA and/or kV according to patient size and/or use of iterative reconstruction technique. COMPARISON:  None. FINDINGS: CT HEAD FINDINGS Brain: Mild generalized cerebral atrophy. Patchy and ill-defined hypoattenuation within the cerebral white matter, nonspecific but compatible with mild chronic small vessel ischemic disease. There is no acute intracranial hemorrhage. No demarcated cortical infarct. No extra-axial fluid collection. No evidence of an intracranial mass. No midline shift. Vascular: No hyperdense vessel.  Atherosclerotic calcifications.  Skull: No calvarial fracture or aggressive osseous lesion. Sinuses/Orbits: No mass or acute finding within the imaged orbits. No significant paranasal sinus disease at the imaged levels. CT CERVICAL SPINE FINDINGS Alignment: Nonspecific reversal of the expected cervical lordosis. 2 mm C2-C3 grade 1 anterolisthesis. Skull base and vertebrae: The basion-dental and atlanto-dental intervals are maintained.No evidence of acute fracture to the cervical spine. 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 Soft tissues and spinal canal: No prevertebral fluid or swelling. No visible canal hematoma. Disc levels: Cervical spondylosis with multilevel disc space narrowing, disc bulges/central disc protrusions, posterior disc osteophyte complexes, uncovertebral hypertrophy and facet arthrosis. Disc space narrowing is advanced at C3-C4, C4-C5, C5-C6 and C6-C7. No appreciable high-grade spinal canal stenosis. Multilevel bony neural foraminal narrowing. Upper chest: No consolidation within the imaged lung apices. No visible pneumothorax. IMPRESSION: CT head: 1. No evidence of an acute intracranial abnormality. 2. Mild chronic small vessel ischemic changes within the cerebral white matter. 3. Mild generalized cerebral atrophy. CT cervical spine: 1. No evidence of an acute cervical spine fracture. 2. Nonspecific reversal of the expected cervical lordosis. 3. 2 mm C2-C3 grade 1 anterolisthesis. 4. Cervical spondylosis as described. 5. 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 Electronically Signed   By: Jackey Loge D.O.   On: 06/01/2023 15:50    Procedures .Laceration Repair  Date/Time: 06/01/2023 4:24 PM  Performed by: Mannie Stabile, PA-C Authorized by: Mannie Stabile, PA-C   Consent:    Consent obtained:  Verbal   Consent given by:  Patient   Risks, benefits, and alternatives were discussed: yes     Risks discussed:  Infection and poor cosmetic result   Alternatives discussed:  No treatment Universal protocol:     Procedure explained and questions answered to patient or proxy's satisfaction: yes     Relevant documents present and verified: yes     Test results available: yes     Imaging studies available: yes     Required blood products, implants, devices, and special equipment available: yes     Site/side marked: yes     Immediately prior to procedure, a time out was called: yes     Patient identity confirmed:  Verbally with patient Anesthesia:    Anesthesia method:  Local infiltration   Local anesthetic:  Lidocaine 1% w/o epi Laceration details:    Location:  Face   Face location:  Forehead   Length (cm):  2   Depth (mm):  3 Pre-procedure details:    Preparation:  Patient was prepped and draped in usual sterile fashion and imaging obtained to evaluate for foreign bodies Exploration:    Limited defect created (wound extended): no     Hemostasis achieved with:  Direct pressure   Imaging obtained comment:  CT   Imaging outcome: foreign body not noted     Wound exploration: wound explored through full range of motion and entire depth of wound visualized     Wound extent: no underlying fracture  Contaminated: no   Treatment:    Area cleansed with:  Saline   Amount of cleaning:  Standard   Irrigation solution:  Sterile saline   Irrigation volume:  150   Irrigation method:  Pressure wash   Visualized foreign bodies/material removed: no     Debridement:  None   Undermining:  None   Scar revision: no   Skin repair:    Repair method:  Sutures   Suture size:  6-0   Suture material:  Prolene   Suture technique:  Simple interrupted   Number of sutures:  2 Approximation:    Approximation:  Close Repair type:    Repair type:  Intermediate Post-procedure details:    Dressing:  Open (no dressing)   Procedure completion:  Tolerated well, no immediate complications     Medications Ordered in ED Medications  Tdap (BOOSTRIX) injection 0.5 mL (0 mLs Intramuscular Hold 06/01/23 1531)   lidocaine (PF) (XYLOCAINE) 1 % injection 5 mL (5 mLs Infiltration Given by Other 06/01/23 1532)    ED Course/ Medical Decision Making/ A&P                                 Medical Decision Making Amount and/or Complexity of Data Reviewed Independent Historian: caregiver Radiology: ordered and independent interpretation performed. Decision-making details documented in ED Course.  Risk Prescription drug management.   This patient presents to the ED for concern of head injury, this involves an extensive number of treatment options, and is a complaint that carries with it a high risk of complications and morbidity.  The differential diagnosis includes intracranial bleed, laceration, concussion, cervical fracture, etc  77 year old female presents to the ED after mechanical fall.  Patient tripped walking up the stairs hitting the left side of her forehead on a brick step.  No LOC.  On ASA 81 mg however, no other blood thinners.  No neurological symptoms. Upon arrival, stable vitals.  Patient no acute distress.  2 cm laceration to left side of forehead.  Normal neurological exam without any neurological deficits.  Patient found her medical records and had a tetanus shot in March. CT head and cervical spine ordered.  Patient will require suture repair of left forehead.  CT head personally reviewed and interpreted which is negative for any acute abnormalities.  Does demonstrate mild chronic small vessel ischemic changes and generalized cerebral atrophy.  CT cervical spine negative for any acute abnormalities.  Suture repair as noted above.  Wound thoroughly cleaned.  Patient tolerated procedure without complications.  Patient able to ambulate in the ED without difficulty.  No other injuries.  Patient stable for discharge. Strict ED precautions discussed with patient. Patient states understanding and agrees to plan. Patient discharged home in no acute distress and stable vitals  Co morbidities that  complicate the patient evaluation  HTN  Social Determinants of Health:  Elderly >65  Discussed with Dr. Earlene Plater who evaluated patient at bedside and agrees with assessment and plan.          Final Clinical Impression(s) / ED Diagnoses Final diagnoses:  Fall, initial encounter  Injury of head, initial encounter  Facial laceration, initial encounter    Rx / DC Orders ED Discharge Orders     None         Jesusita Oka 06/01/23 1630    Laurence Spates, MD 06/01/23 2156

## 2023-06-01 NOTE — Discharge Instructions (Addendum)
It was a pleasure taking care of you today.  As discussed, you will need your stitches removed in 4 to 5 days.  You may take over-the-counter ibuprofen or Tylenol as needed for pain.  CT head and cervical spine did not show any acute abnormalities.  Please follow-up with PCP for recheck in 2 to 3 days.  Return to the ER for new or worsening symptoms.

## 2023-06-06 DIAGNOSIS — S0101XD Laceration without foreign body of scalp, subsequent encounter: Secondary | ICD-10-CM | POA: Diagnosis not present

## 2023-06-06 DIAGNOSIS — Z9181 History of falling: Secondary | ICD-10-CM | POA: Diagnosis not present

## 2023-07-08 DIAGNOSIS — H9193 Unspecified hearing loss, bilateral: Secondary | ICD-10-CM | POA: Diagnosis not present

## 2023-07-08 DIAGNOSIS — H9313 Tinnitus, bilateral: Secondary | ICD-10-CM | POA: Diagnosis not present

## 2023-07-08 DIAGNOSIS — H903 Sensorineural hearing loss, bilateral: Secondary | ICD-10-CM | POA: Diagnosis not present

## 2023-07-29 ENCOUNTER — Telehealth: Payer: Self-pay | Admitting: Primary Care

## 2023-07-29 NOTE — Telephone Encounter (Signed)
[facility] [address]  in [place]  Pt would like [place]  Her # is [phone]

## 2023-07-31 ENCOUNTER — Encounter (HOSPITAL_BASED_OUTPATIENT_CLINIC_OR_DEPARTMENT_OTHER): Payer: Self-pay

## 2023-07-31 ENCOUNTER — Telehealth: Payer: Self-pay | Admitting: Primary Care

## 2023-07-31 NOTE — Telephone Encounter (Signed)
Patient needs refill of [employer]

## 2023-08-01 MED ORDER — BREZTRI AEROSPHERE 160-9-4.8 MCG/ACT IN AERO: 2.0000 | INHALATION_SPRAY | Freq: Two times a day (BID) | RESPIRATORY_TRACT | 4 refills | Status: DC

## 2023-08-01 NOTE — Telephone Encounter (Signed)
I called and spoke with the pt. Pt states she needs a refill on Breztri. I advised pt I would send some in for her. Pt verbalized understanding. NFN

## 2023-08-07 NOTE — Telephone Encounter (Signed)
 NFN

## 2023-08-11 DIAGNOSIS — E785 Hyperlipidemia, unspecified: Secondary | ICD-10-CM | POA: Diagnosis not present

## 2023-08-11 DIAGNOSIS — Z791 Long term (current) use of non-steroidal anti-inflammatories (NSAID): Secondary | ICD-10-CM | POA: Diagnosis not present

## 2023-08-11 DIAGNOSIS — Z8249 Family history of ischemic heart disease and other diseases of the circulatory system: Secondary | ICD-10-CM | POA: Diagnosis not present

## 2023-08-11 DIAGNOSIS — I10 Essential (primary) hypertension: Secondary | ICD-10-CM | POA: Diagnosis not present

## 2023-08-11 DIAGNOSIS — M1990 Unspecified osteoarthritis, unspecified site: Secondary | ICD-10-CM | POA: Diagnosis not present

## 2023-08-11 DIAGNOSIS — J449 Chronic obstructive pulmonary disease, unspecified: Secondary | ICD-10-CM | POA: Diagnosis not present

## 2023-08-11 DIAGNOSIS — R609 Edema, unspecified: Secondary | ICD-10-CM | POA: Diagnosis not present

## 2023-08-11 DIAGNOSIS — Z823 Family history of stroke: Secondary | ICD-10-CM | POA: Diagnosis not present

## 2023-08-11 DIAGNOSIS — I739 Peripheral vascular disease, unspecified: Secondary | ICD-10-CM | POA: Diagnosis not present

## 2023-08-11 DIAGNOSIS — Z880 Allergy status to penicillin: Secondary | ICD-10-CM | POA: Diagnosis not present

## 2023-08-11 DIAGNOSIS — M4800 Spinal stenosis, site unspecified: Secondary | ICD-10-CM | POA: Diagnosis not present

## 2023-08-11 DIAGNOSIS — Z87891 Personal history of nicotine dependence: Secondary | ICD-10-CM | POA: Diagnosis not present

## 2023-08-15 ENCOUNTER — Other Ambulatory Visit: Payer: Self-pay

## 2023-08-15 ENCOUNTER — Telehealth: Payer: Self-pay

## 2023-08-15 DIAGNOSIS — I70221 Atherosclerosis of native arteries of extremities with rest pain, right leg: Secondary | ICD-10-CM

## 2023-08-15 NOTE — Telephone Encounter (Signed)
RETURNED CALL

## 2023-09-01 ENCOUNTER — Ambulatory Visit: Payer: Medicare HMO | Admitting: Primary Care

## 2023-09-01 ENCOUNTER — Encounter: Payer: Self-pay | Admitting: Primary Care

## 2023-09-01 VITALS — BP 126/66 | HR 54 | Temp 97.6°F | Ht 61.0 in | Wt 130.6 lb

## 2023-09-01 DIAGNOSIS — J4489 Other specified chronic obstructive pulmonary disease: Secondary | ICD-10-CM | POA: Diagnosis not present

## 2023-09-01 MED ORDER — BREZTRI AEROSPHERE 160-9-4.8 MCG/ACT IN AERO: 2.0000 | INHALATION_SPRAY | Freq: Two times a day (BID) | RESPIRATORY_TRACT | Status: DC

## 2023-09-01 NOTE — Progress Notes (Signed)
 @Patient  ID: Shannon Jenkins, female    DOB: 10/12/1945, 78 y.o.   MRN: [id number]  Chief Complaint  Patient presents with   Follow-up    Referring provider: Aliene Beams, MD  HPI: 13 year, former smoker. PMH significant for hypertension, GERD and hypothyroidism.   Previous [facility] pulmonary  12/12/2022 Patient presents today for a follow-up with pulmonary function testing. She was seen by Dr. Thora Lance for initial consult in March for dyspnea. She was started on Breztri 2 puffs twice daily and ordered for pulmonary function testing.  Former smoker quit in 1989, 20 pack year smoking hx. She is minimally symptomatic. Is not particularly effected by her breathing. She only experiences shortness of breath symptoms when walking really fast or when she gets an upper respiratory infection. Her daughter did noticed her breathing was better. Wheezing improved while taking Breztri. She is not limited doing any activities due to her breathing. CAT 5   COPD with asthma - Former smoker quit in 1989, 20 pack year smoking hx. She experiences mild dyspnea/wheezing with exertion. Not limited by her breathing. No associated Cough.  CAT score 5.  Some perceived benefit from trial [employer].  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 Since she is minimally symptomatic, recommend deescalating therapy to ICS/LABA. We will check with pharmacy to see what inhaler is formulary on her plan.  For now she will continue Breztri until she finishes current inhaler.  Needs alpha-1 testing today. Advise patient take Mucinex 600 mg twice daily as needed for chest congestion/cough. Follow-up in 6 months or sooner if needed.    09/01/2023- Interim hx  Shannon Jenkins is a 78 year old female with COPD and asthma who presents for a follow-up visit.  She has COPD with an asthmatic component, characterized by reversibility with  albuterol as shown in a breathing test from June 2024. She has not experienced any hospitalizations or respiratory infections requiring antibiotics or prednisone since her last visit. She quit smoking in 1989.  She uses Breztri once daily in the morning, although it is prescribed for twice daily use. Her decision to use it once daily is influenced by the increased cost of the medication. Despite this, her symptoms are well controlled, and she does not experience shortness of breath, chest tightness, or require the use of a rescue inhaler. She has an expired albuterol inhaler but does not feel the need for it currently.  In a recent assessment, she rated her cough and phlegm in the throat as a 'one' on a scale of zero to five, indicating minimal symptoms. She experiences slight breathlessness when walking up a hill or a flight of stairs, also rated as a 'one'. She reports no limitations in activities at home, confidence in leaving home, sound sleep, and maintains good energy levels, with a slight reduction in energy rated as a 'one'. Her overall symptom score was four out of forty, consistent with her previous score of five, indicating stable control of her symptoms.       Pulmonary function testing  12/12/2022 FVC 1.86 (74%), FEV1 1.12 (59%), ratio 60, TLC 130%, DLCO 13.36 (76%) Impression: Moderate obstruction with positive bronchodilator response and mild diffusion defect    Allergies  Allergen Reactions   Lisinopril     Other reaction(s): cough   Penicillins Hives and Rash    Immunization History  Administered Date(s) Administered   PFIZER Comirnaty(Gray Top)Covid-19 Tri-Sucrose Vaccine 08/03/2019, 08/26/2019, 04/06/2020  Past Medical History:  Diagnosis Date   Hypertension    Thyroid disease     Tobacco History: Social History   Tobacco Use  Smoking Status Former   Current packs/day: 0.00   Types: Cigarettes   Quit date: 1989   Years since quitting: 36.1  Smokeless  Tobacco Not on file   Counseling given: Not Answered   Outpatient Medications Prior to Visit  Medication Sig Dispense Refill   Budeson-Glycopyrrol-Formoterol (BREZTRI AEROSPHERE) 160-9-4.8 MCG/ACT AERO Inhale 2 puffs into the lungs in the morning and at bedtime. 10.7 g 4   cyclobenzaprine (FLEXERIL) 10 MG tablet Take 1 tablet (10 mg total) by mouth at bedtime. 30 tablet 0   gabapentin (NEURONTIN) 100 MG capsule Take 1 capsule (100 mg total) by mouth at bedtime. 60 capsule 1   mupirocin ointment (BACTROBAN) 2 % Apply 1 Application topically 2 (two) times daily. (Patient not taking: Reported on 09/11/2022) 22 g 1   Vitamin D, Ergocalciferol, (DRISDOL) 1.25 MG (50000 UNIT) CAPS capsule Take 50,000 Units by mouth once a week.     No facility-administered medications prior to visit.   Review of Systems  Review of Systems  Constitutional: Negative.   HENT: Negative.    Respiratory: Negative.  Negative for cough, shortness of breath and wheezing.   Cardiovascular: Negative.    Physical Exam  There were no vitals taken for this visit. Physical Exam Constitutional:      General: She is not in acute distress.    Appearance: Normal appearance. She is not ill-appearing.  HENT:     Head: Normocephalic and atraumatic.  Cardiovascular:     Rate and Rhythm: Normal rate and regular rhythm.  Pulmonary:     Effort: Pulmonary effort is normal.     Breath sounds: Normal breath sounds. No wheezing, rhonchi or rales.  Neurological:     General: No focal deficit present.     Mental Status: She is alert and oriented to person, place, and time. Mental status is at baseline.  Psychiatric:        Mood and Affect: Mood normal.        Behavior: Behavior normal.        Thought Content: Thought content normal.        Judgment: Judgment normal.      Lab Results:  CBC No results found for: "WBC", "RBC", "HGB", "HCT", "PLT", "MCV", "MCH", "MCHC", "RDW", "LYMPHSABS", "MONOABS", "EOSABS",  "BASOSABS"  BMET No results found for: "NA", "K", "CL", "CO2", "GLUCOSE", "BUN", "CREATININE", "CALCIUM", "GFRNONAA", "GFRAA"  BNP No results found for: "BNP"  ProBNP No results found for: "PROBNP"  Imaging: No results found.   Assessment & Plan:   1. COPD with asthma (HCC) (Primary)     COPD/Asthma Overlap Well controlled on once daily Breztri (meant to be BID) due to cost concerns. No recent exacerbations, hospitalizations, or need for rescue inhaler. CAT score 4. Patient understands to increase Breztri to BID if symptoms worsen and to contact office for assistance if needed.  -Continue Breztri once daily as long as symptoms remain controlled. -Provide patient with Breztri samples. -Consider alternative treatments if cost continues to be a barrier. -Plan for follow-up in 6 months      Glenford Bayley, NP 09/01/2023

## 2023-09-01 NOTE — Patient Instructions (Signed)
 -  COPD/ASTHMA OVERLAP: COPD with an asthmatic component means you have chronic obstructive pulmonary disease with some features of asthma, such as reversibility with medication. Your symptoms are well controlled with once daily use of Breztri, although it is prescribed for twice daily use. Continue using Breztri once daily as long as your symptoms remain controlled. If your symptoms worsen, increase the use to twice daily and contact our office for assistance. We will provide you with Breztri samples and consider alternative treatments if cost continues to be a barrier.  Follow-up 6 months with Waynetta Sandy NP

## 2023-09-08 ENCOUNTER — Other Ambulatory Visit: Payer: Self-pay

## 2023-09-08 ENCOUNTER — Ambulatory Visit (HOSPITAL_COMMUNITY)
Admission: RE | Admit: 2023-09-08 | Discharge: 2023-09-08 | Disposition: A | Discharge status: Home / Self Care | Payer: Medicare HMO | Attending: Vascular Surgery | Admitting: Vascular Surgery

## 2023-09-08 ENCOUNTER — Encounter (HOSPITAL_COMMUNITY)
Admission: RE | Disposition: A | Discharge status: Home / Self Care | Payer: Self-pay | Source: Home / Self Care | Attending: Vascular Surgery

## 2023-09-08 DIAGNOSIS — I70221 Atherosclerosis of native arteries of extremities with rest pain, right leg: Secondary | ICD-10-CM | POA: Insufficient documentation

## 2023-09-08 DIAGNOSIS — Z87891 Personal history of nicotine dependence: Secondary | ICD-10-CM | POA: Diagnosis not present

## 2023-09-08 HISTORY — PX: LOWER EXTREMITY INTERVENTION: CATH118252

## 2023-09-08 HISTORY — PX: ABDOMINAL AORTOGRAM W/LOWER EXTREMITY: CATH118223

## 2023-09-08 LAB — POCT I-STAT, CHEM 8
BUN: 16 mg/dL (ref 8–23)
Calcium, Ion: 1.27 mmol/L (ref 1.15–1.40)
Chloride: 104 mmol/L (ref 98–111)
Creatinine, Ser: 0.8 mg/dL (ref 0.44–1.00)
Glucose, Bld: 79 mg/dL (ref 70–99)
HCT: 44 % (ref 36.0–46.0)
Hemoglobin: 15 g/dL (ref 12.0–15.0)
Potassium: 3.5 mmol/L (ref 3.5–5.1)
Sodium: 141 mmol/L (ref 135–145)
TCO2: 28 mmol/L (ref 22–32)

## 2023-09-08 SURGERY — ABDOMINAL AORTOGRAM W/LOWER EXTREMITY
Anesthesia: LOCAL | Laterality: Right

## 2023-09-08 MED ORDER — CLOPIDOGREL BISULFATE 300 MG PO TABS
ORAL_TABLET | ORAL | Status: AC
  Filled 2023-09-08: qty 1

## 2023-09-08 MED ORDER — FENTANYL CITRATE (PF) 100 MCG/2ML IJ SOLN
INTRAMUSCULAR | Status: DC | PRN
  Administered 2023-09-08: 50 ug via INTRAVENOUS

## 2023-09-08 MED ORDER — ASPIRIN 81 MG PO CHEW
CHEWABLE_TABLET | ORAL | Status: DC | PRN
Start: 2023-09-08 — End: 2023-09-08
  Administered 2023-09-08: 81 mg via ORAL

## 2023-09-08 MED ORDER — IODIXANOL 320 MG/ML IV SOLN
INTRAVENOUS | Status: DC | PRN
  Administered 2023-09-08: 60 mL

## 2023-09-08 MED ORDER — CLOPIDOGREL BISULFATE 75 MG PO TABS: 75.0000 mg | ORAL_TABLET | Freq: Every day | ORAL | 11 refills | Status: AC

## 2023-09-08 MED ORDER — MIDAZOLAM HCL 2 MG/2ML IJ SOLN
INTRAMUSCULAR | Status: AC
  Filled 2023-09-08: qty 2

## 2023-09-08 MED ORDER — LIDOCAINE HCL (PF) 1 % IJ SOLN
INTRAMUSCULAR | Status: AC
  Filled 2023-09-08: qty 30

## 2023-09-08 MED ORDER — FENTANYL CITRATE (PF) 100 MCG/2ML IJ SOLN
INTRAMUSCULAR | Status: AC
Start: 2023-09-08 — End: ?
  Filled 2023-09-08: qty 2

## 2023-09-08 MED ORDER — METHYLPREDNISOLONE SODIUM SUCC 125 MG IJ SOLR
125.0000 mg | Freq: Once | INTRAMUSCULAR | Status: AC
  Administered 2023-09-08: 125 mg via INTRAVENOUS
  Filled 2023-09-08: qty 2

## 2023-09-08 MED ORDER — HEPARIN (PORCINE) IN NACL 1000-0.9 UT/500ML-% IV SOLN
INTRAVENOUS | Status: DC | PRN
  Administered 2023-09-08 (×2): 500 mL

## 2023-09-08 MED ORDER — MIDAZOLAM HCL 2 MG/2ML IJ SOLN
INTRAMUSCULAR | Status: DC | PRN
  Administered 2023-09-08: 1 mg via INTRAVENOUS

## 2023-09-08 MED ORDER — ASPIRIN 81 MG PO CHEW
CHEWABLE_TABLET | ORAL | Status: AC
  Filled 2023-09-08: qty 1

## 2023-09-08 MED ORDER — SODIUM CHLORIDE 0.9 % IV SOLN: INTRAVENOUS | Status: DC

## 2023-09-08 MED ORDER — LIDOCAINE HCL (PF) 1 % IJ SOLN
INTRAMUSCULAR | Status: DC | PRN
  Administered 2023-09-08: 15 mL

## 2023-09-08 MED ORDER — CLOPIDOGREL BISULFATE 300 MG PO TABS
ORAL_TABLET | ORAL | Status: DC | PRN
Start: 2023-09-08 — End: 2023-09-08
  Administered 2023-09-08: 300 mg via ORAL

## 2023-09-08 MED ORDER — HEPARIN SODIUM (PORCINE) 1000 UNIT/ML IJ SOLN
INTRAMUSCULAR | Status: AC
  Filled 2023-09-08: qty 10

## 2023-09-08 MED ORDER — HEPARIN SODIUM (PORCINE) 1000 UNIT/ML IJ SOLN
INTRAMUSCULAR | Status: DC | PRN
  Administered 2023-09-08: 6000 [IU] via INTRAVENOUS

## 2023-09-08 MED ORDER — DIPHENHYDRAMINE HCL 50 MG/ML IJ SOLN
25.0000 mg | Freq: Once | INTRAMUSCULAR | Status: AC
  Administered 2023-09-08: 25 mg via INTRAVENOUS
  Filled 2023-09-08: qty 1

## 2023-09-08 SURGICAL SUPPLY — 24 items
BALLN STERLING OTW 3X150X150 (BALLOONS) ×2 IMPLANT
BALLN STERLING OTW 4X220X150 (BALLOONS) ×2 IMPLANT
BALLOON STERLING OTW 3X150X150 (BALLOONS) IMPLANT
BALLOON STERLING OTW 4X220X150 (BALLOONS) IMPLANT
CATH AURYON ATHREC 1.7 (CATHETERS) IMPLANT
CATH OMNI FLUSH 5F 65CM (CATHETERS) IMPLANT
CATH QUICKCROSS SUPP .035X90CM (MICROCATHETER) IMPLANT
CLOSURE MYNX CONTROL 6F/7F (Vascular Products) IMPLANT
COVER DOME SNAP 22 D (MISCELLANEOUS) IMPLANT
DCB RANGER 4.0X200 150 (BALLOONS) IMPLANT
GLIDEWIRE ADV .035X260CM (WIRE) IMPLANT
GUIDEWIRE ANGLED .035X260CM (WIRE) IMPLANT
KIT ENCORE 26 ADVANTAGE (KITS) IMPLANT
KIT MICROPUNCTURE NIT STIFF (SHEATH) IMPLANT
KIT SINGLE USE MANIFOLD (KITS) IMPLANT
PACK CARDIAC CATHETERIZATION (CUSTOM PROCEDURE TRAY) IMPLANT
RANGER DCB 4.0X200 150 (BALLOONS) ×2 IMPLANT
SET ATX-X65L (MISCELLANEOUS) IMPLANT
SHEATH CATAPULT 6FR 45 (SHEATH) IMPLANT
SHEATH PINNACLE 5F 10CM (SHEATH) IMPLANT
SHEATH PINNACLE 6F 10CM (SHEATH) IMPLANT
SHEATH PROBE COVER 6X72 (BAG) IMPLANT
WIRE BENTSON .035X145CM (WIRE) IMPLANT
WIRE G V18X300CM (WIRE) IMPLANT

## 2023-09-08 NOTE — Op Note (Signed)
 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 Randie Heinz, MD Procedure Performed: 1.  Percutaneous ultrasound-guided access and Mynx device closure left common femoral artery 2.  Catheter in aorta and aortogram 3.  Selection of right popliteal artery and right lower extremity angiogram 4.  Left lower extremity angiogram 5.  Laser arthrectomy right SFA, popliteal artery and anterior tibial arteries 6.  Drug-coated balloon angioplasty right SFA and popliteal artery with 4 mm Ranger and plain balloon angioplasty right anterior tibial artery with 3 mm Sterling 7.  Moderate sedation with fentanyl and Versed for 61 minutes   Indications: 78 year old female with right lower extremity rest pain with triphasic waveforms and no symptoms on the left.  She has very short distance claudication as well or tissue loss.  Her most recent toe pressure was 13 with moderately depressed monophasic ABIs on the right.  She is now indicated for angiography with possible invention.  Findings: Aorta and iliac segments are free of flow-limiting stenosis.  Bilateral common femoral arteries were patent as was the left proximal SFA.  On the right side the mid SFA occludes for approximately 10 cm and reconstitutes the diseased artery above the knee at the adductor and then there is a 60% stenosis of the anterior tibial artery and she has single-vessel runoff via the anterior tibial artery with occluded tibioperoneal trunk but she does reconstituted peroneal and posterior artery, these do not make it to the foot after reconstitution.  After treatment of the anterior tibial artery this was reduced to 0% residual stenosis with brisk runoff via the anterior tibial artery to the foot filling the dorsalis pedis.  Treated with drug-coated balloon  angioplasty of the SFA and popliteal resulted in 0% residual stenosis with no flow-limiting dissection.  She had a very strong anterior tibial signal at completion.   Procedure:  The patient was identified in the holding area and taken to room 8.  The patient was then placed supine on the table and prepped and draped in the usual sterile fashion.  A time out was called.  Ultrasound was used to evaluate the left common femoral artery which was free of disease.  The area was anesthetized 1% lidocaine and cannulated with a micropuncture needle followed by wire and a sheath.  Ultrasound image was saved the record.  Concomitantly we administered fentanyl and Versed as moderate sedation her vital signs were monitored throughout the case.  We placed a Bentson wire and Omni catheter to the level of L1 and aortogram was performed.  Following this we pulled down to the aortic bifurcation and performed angiography which demonstrated no flow-limiting stenosis in the iliacs bilaterally.  We then crossed the bifurcation perform right lower extremity angiography and with the above findings we then placed a Bentson wire and a long 6 French sheath the patient was fully heparinized.  Using Glidewire and quick cross catheter we are able to cross the occluded area into the below-knee popliteal artery and confirmed intraluminal access which also demonstrated anterior tibial stenosis.  The V18 wire was then crossed the anterior tibial artery stenosis and laser arthrectomy was performed long segment SFA and popliteal artery occlusion and the anterior tibial artery.  The right anterior tibial artery and distal popliteal artery were then balloon angioplasty with 3 mm balloon followed by 4 mm balloon angioplasty of the long segment  SFA and popliteal occluded area.  Completion demonstrated no residual stenosis or dissection and a drug-coated balloon was used in the SFA popliteal segment at nominal pressure for 3 minutes.  Completion again  demonstrated brisk flow through the right SFA/popliteal into the right anterior tibial artery with single-vessel runoff filling the dorsalis pedis.  Satisfied with this we retracted the long sheath into the left external iliac artery and perform retrograde angiography which demonstrated healthy appearing left extrailiac artery and left common femoral artery.  We exchanged for a short 6 French sheath and deployed a minx device.  She tolerated the procedure without any complication at completion there was a very strong anterior tibial artery traced down to the foot is a dorsalis pedis on the right side.   Contrast: 60 cc  Khianna Blazina C. Randie Heinz, MD [facility] [place] Office: [phone] Pager: [phone]

## 2023-09-08 NOTE — H&P (Signed)
 HPI:   Shannon Jenkins is a 78 y.o. female has been evaluated for nocturnal foot pain with podiatry and found to have significantly decreased ABIs.  She is a former smoker but quit many years ago.  No other risk factors for vascular disease.  She states that she walks without limitation.  She denies any tissue loss or ulceration no history of stroke, TIA or amaurosis and no history of coronary artery disease.  She takes a statin weekly and has been prescribed 81 mg of aspirin has yet to start.  States that her foot pain really comes on the right foot in the middle the night and resolves with hanging her foot over the side of the bed.  Denies any previous lower extremity interventions and has not had this issue up until about 1 month ago.   History reviewed. No pertinent past medical history.          Family History  Problem Relation Age of Onset   Breast cancer Mother     Breast cancer Maternal Aunt          History reviewed. No pertinent surgical history.       Short Social History:  Social History         Tobacco Use   Smoking status: Former      Types: Cigarettes      Quit date: 1989      Years since quitting: 35.3   Smokeless tobacco: Not on file  Substance Use Topics   Alcohol use: Not on file      Allergies       Allergies  Allergen Reactions   Lisinopril        Other reaction(s): cough   Penicillins Hives and Rash              Current Outpatient Medications  Medication Sig Dispense Refill   Budeson-Glycopyrrol-Formoterol (BREZTRI AEROSPHERE) 160-9-4.8 MCG/ACT AERO Inhale 2 puffs into the lungs in the morning and at bedtime. 10.7 g 4   cyclobenzaprine (FLEXERIL) 10 MG tablet Take 1 tablet (10 mg total) by mouth at bedtime. 30 tablet 0   gabapentin (NEURONTIN) 100 MG capsule Take 1 capsule (100 mg total) by mouth at bedtime. (Patient not taking: Reported on 09/11/2022) 60 capsule 1   mupirocin ointment (BACTROBAN) 2 % Apply 1 Application topically 2 (two)  times daily. (Patient not taking: Reported on 09/11/2022) 22 g 1   Vitamin D, Ergocalciferol, (DRISDOL) 1.25 MG (50000 UNIT) CAPS capsule Take 50,000 Units by mouth once a week.          No current facility-administered medications for this visit.        Review of Systems  Constitutional:  Constitutional negative. HENT: HENT negative.  Eyes: Eyes negative.  Respiratory: Respiratory negative.  Cardiovascular: Cardiovascular negative.  GI: Gastrointestinal negative.  Musculoskeletal: Positive for leg pain.  Neurological: Neurological negative. Psychiatric: Psychiatric negative.          Objective:    Vitals:   09/08/23 0839  BP: (!) 129/55  Pulse: 61  Resp: 17  Temp: 97.6 F (36.4 C)  SpO2: 100%    Physical Exam HENT:     Head: Normocephalic.     Nose: Nose normal.  Eyes:     Pupils: Pupils are equal, round, and reactive to light.  Neck:     Vascular: Carotid bruit present.  Cardiovascular:     Rate and Rhythm: Normal rate.     Pulses:  Femoral pulses are 2+ on the right side and 2+ on the left side.      Popliteal pulses are 0 on the right side and 2+ on the left side.       Dorsalis pedis pulses are 0 on the right side and 2+ on the left side.       Posterior tibial pulses are 0 on the right side.  Pulmonary:     Effort: Pulmonary effort is normal.  Abdominal:     General: Abdomen is flat.     Palpations: Abdomen is soft.  Skin:    General: Skin is warm.     Capillary Refill: Capillary refill takes more than 3 seconds.  Neurological:     General: No focal deficit present.     Mental Status: She is alert.  Psychiatric:        Mood and Affect: Mood normal.        Thought Content: Thought content normal.        Judgment: Judgment normal.        Data: ABI Findings:  +---------+------------------+-----+-------------------+--------+  Right   Rt Pressure (mmHg)IndexWaveform           Comment    +---------+------------------+-----+-------------------+--------+  Brachial 112                                                 +---------+------------------+-----+-------------------+--------+  PTA     79                0.66 dampened monophasic          +---------+------------------+-----+-------------------+--------+  DP      64                0.53 monophasic                   +---------+------------------+-----+-------------------+--------+  Great Toe13                0.11 Abnormal                     +---------+------------------+-----+-------------------+--------+   +---------+------------------+-----+---------+-------+  Left    Lt Pressure (mmHg)IndexWaveform Comment  +---------+------------------+-----+---------+-------+  Brachial 120                                      +---------+------------------+-----+---------+-------+  PTA     138               1.15 triphasic         +---------+------------------+-----+---------+-------+  DP      132               1.10 triphasic         +---------+------------------+-----+---------+-------+  Great Toe76                0.63 Abnormal          +---------+------------------+-----+---------+-------+   +-------+-----------+-----------+------------+------------+  ABI/TBIToday's ABIToday's TBIPrevious ABIPrevious TBI  +-------+-----------+-----------+------------+------------+  Right 0.66       0.11                                 +-------+-----------+-----------+------------+------------+  Left  1.15       0.63                                 +-------+-----------+-----------+------------+------------+  Summary:  Right: Resting right ankle-brachial index 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 The right toe-brachial index is abnormal.   Left: Resting left ankle-brachial index is within normal range. The left  toe-brachial index is  abnormal.       Assessment/Plan:    78 year old female with right foot classic rest pain secondary to likely SFA and popliteal artery stenoses or occlusions with possible multilevel disease given her severely depressed toe pressures.  Plan for aortogram with bilateral lower extremity runoff possible intervention right lower extremity today from the left common approach.  We again discussed the risk and benefits that she demonstrates good understanding.    Istvan Behar C. Randie Heinz, MD [facility] [place] Office: [phone] Pager: [phone]

## 2023-09-08 NOTE — Progress Notes (Signed)
Pt ambulated to and from bathroom to void with no signs of oozing from left groin site  

## 2023-09-09 ENCOUNTER — Encounter (HOSPITAL_COMMUNITY): Payer: Self-pay | Admitting: Vascular Surgery

## 2023-09-11 DIAGNOSIS — R52 Pain, unspecified: Secondary | ICD-10-CM | POA: Diagnosis not present

## 2023-09-11 DIAGNOSIS — R051 Acute cough: Secondary | ICD-10-CM | POA: Diagnosis not present

## 2023-09-11 DIAGNOSIS — R5383 Other fatigue: Secondary | ICD-10-CM | POA: Diagnosis not present

## 2023-09-11 DIAGNOSIS — R6883 Chills (without fever): Secondary | ICD-10-CM | POA: Diagnosis not present

## 2023-09-11 DIAGNOSIS — Z03818 Encounter for observation for suspected exposure to other biological agents ruled out: Secondary | ICD-10-CM | POA: Diagnosis not present

## 2023-09-11 DIAGNOSIS — J101 Influenza due to other identified influenza virus with other respiratory manifestations: Secondary | ICD-10-CM | POA: Diagnosis not present

## 2023-09-23 ENCOUNTER — Telehealth: Payer: Self-pay

## 2023-09-23 NOTE — Telephone Encounter (Signed)
 Advice Only: -pt LM stating she had some ankle swelling on her R side that started yesterday.  She elevated her leg last night and it resolved but after being up a few hours today it returned.  -advised pt to try wearing her compression hose for a few days to see if it resolves.  She states she will do that and she sees her PCP next week and returns to our offices after that so she will let us know if it worsens.

## 2023-09-29 ENCOUNTER — Other Ambulatory Visit: Payer: Self-pay | Admitting: *Deleted

## 2023-09-29 DIAGNOSIS — I70221 Atherosclerosis of native arteries of extremities with rest pain, right leg: Secondary | ICD-10-CM

## 2023-09-30 ENCOUNTER — Ambulatory Visit (HOSPITAL_COMMUNITY)
Admission: RE | Admit: 2023-09-30 | Discharge: 2023-09-30 | Disposition: A | Discharge status: Home / Self Care | Source: Ambulatory Visit | Attending: Surgery | Admitting: Surgery

## 2023-09-30 ENCOUNTER — Ambulatory Visit (INDEPENDENT_AMBULATORY_CARE_PROVIDER_SITE_OTHER)
Admission: RE | Admit: 2023-09-30 | Discharge: 2023-09-30 | Disposition: A | Discharge status: Home / Self Care | Source: Ambulatory Visit | Attending: Surgery | Admitting: Surgery

## 2023-09-30 DIAGNOSIS — I70221 Atherosclerosis of native arteries of extremities with rest pain, right leg: Secondary | ICD-10-CM | POA: Insufficient documentation

## 2023-09-30 LAB — VAS US ABI WITH/WO TBI
Left ABI: 1.11
Right ABI: 0.88

## 2023-10-03 ENCOUNTER — Other Ambulatory Visit: Payer: Self-pay | Admitting: Family Medicine

## 2023-10-03 DIAGNOSIS — Z79899 Other long term (current) drug therapy: Secondary | ICD-10-CM | POA: Diagnosis not present

## 2023-10-03 DIAGNOSIS — E890 Postprocedural hypothyroidism: Secondary | ICD-10-CM | POA: Diagnosis not present

## 2023-10-03 DIAGNOSIS — E7800 Pure hypercholesterolemia, unspecified: Secondary | ICD-10-CM | POA: Diagnosis not present

## 2023-10-03 DIAGNOSIS — Z1231 Encounter for screening mammogram for malignant neoplasm of breast: Secondary | ICD-10-CM

## 2023-10-03 DIAGNOSIS — E559 Vitamin D deficiency, unspecified: Secondary | ICD-10-CM | POA: Diagnosis not present

## 2023-10-03 DIAGNOSIS — I10 Essential (primary) hypertension: Secondary | ICD-10-CM | POA: Diagnosis not present

## 2023-10-15 ENCOUNTER — Encounter (HOSPITAL_COMMUNITY)

## 2023-10-22 ENCOUNTER — Ambulatory Visit (INDEPENDENT_AMBULATORY_CARE_PROVIDER_SITE_OTHER): Admitting: Physician Assistant

## 2023-10-22 VITALS — BP 109/64 | HR 62 | Temp 98.3°F | Ht 61.0 in | Wt 130.4 lb

## 2023-10-22 DIAGNOSIS — I70221 Atherosclerosis of native arteries of extremities with rest pain, right leg: Secondary | ICD-10-CM | POA: Diagnosis not present

## 2023-10-22 NOTE — Progress Notes (Signed)
 HISTORY AND PHYSICAL     CC:  follow up. Requesting Provider:  Dorena Gander, MD  HPI: This is a 78 y.o. female who is here today for follow up for PAD.  Pt has hx of aortogram with laser arthrectomy right SFA, popliteal artery and anterior tibial arteries, drug-coated balloon angioplasty right SFA and popliteal artery with and plain balloon angioplasty right anterior tibial artery via left CFA on 09/08/2023 by Dr. Vikki Graves for rest pain.  The pt returns today for follow up.  She states that the pain/cramping she had in the foot at night has resolved.  She is now having some swelling in the right leg and has some pain around the lower leg and ankle. She states the swelling improves after being in bed overnight.  She denies any ulcerations or claudication.  She does not have any issues with the left foot.   The pt is on a statin for cholesterol management.    The pt is on an aspirin .    Other AC:  Plavix  The pt is on ARB, diuretic for hypertension.  The pt is not on medication for diabetes. Tobacco hx:  former  Pt does not have family hx of AAA.  Past Medical History:  Diagnosis Date   Hypertension    Thyroid  disease     Past Surgical History:  Procedure Laterality Date   ABDOMINAL AORTOGRAM W/LOWER EXTREMITY N/A 09/08/2023   Procedure: ABDOMINAL AORTOGRAM W/LOWER EXTREMITY;  Surgeon: Adine Hoof, MD;  Location[facility] INVASIVE CV LAB;  Service: Cardiovascular;  Laterality: N/A;   CESAREAN SECTION     CHOLECYSTECTOMY     LOWER EXTREMITY INTERVENTION Right 09/08/2023   Procedure: LOWER EXTREMITY INTERVENTION;  Surgeon: Adine Hoof, MD;  Location: [facility] INVASIVE CV LAB;  Service: Cardiovascular;  Laterality: Right;  SFA, POP    Allergies  Allergen Reactions   Iodinated Contrast Media Other (See Comments)    Caused Pt to get very hot, and could have caused Pt to go in shock    Lisinopril Cough   Penicillins Hives and Rash    Current Outpatient Medications   Medication Sig Dispense Refill   aspirin  EC 81 MG tablet Take 81 mg by mouth daily. Swallow whole.     Biotin 5000 MCG TABS Take 5,000 mcg by mouth daily.     Budeson-Glycopyrrol-Formoterol (BREZTRI  AEROSPHERE) 160-9-4.8 MCG/ACT AERO Inhale 2 puffs into the lungs in the morning and at bedtime. (Patient taking differently: Inhale 2 puffs into the lungs every morning.) 10.7 g 4   Calcium-Magnesium-Zinc (CAL-MAG-ZINC PO) Take 1 tablet by mouth daily.     Cholecalciferol (VITAMIN D3) 50 MCG (2000 UT) capsule Take 2,000 Units by mouth daily.     clopidogrel  (PLAVIX ) 75 MG tablet Take 1 tablet (75 mg total) by mouth daily. 30 tablet 11   hydrochlorothiazide (HYDRODIURIL) 12.5 MG tablet Take 12.5 mg by mouth every morning.     Multiple Vitamins-Minerals (MULTIVITAMIN WITH MINERALS) tablet Take 1 tablet by mouth daily.     olmesartan (BENICAR) 40 MG tablet Take 40 mg by mouth daily.     Probiotic Product (PROBIOTIC DAILY PO) Take 1 capsule by mouth daily.     rosuvastatin (CRESTOR) 20 MG tablet Take 20 mg by mouth 2 (two) times a week. Mon and Thurs     SYNTHROID 50 MCG tablet Take 50-75 mcg by mouth See admin instructions. 50 mcg Mon-Fri, 75 mcg Sat-Sun     No current facility-administered medications for this visit.  Family History  Problem Relation Age of Onset   Breast cancer Mother    Breast cancer Maternal Aunt     Social History   Socioeconomic History   Marital status: Single    Spouse name: Not on file   Number of children: Not on file   Years of education: Not on file   Highest education level: Not on file  Occupational History   Not on file  Tobacco Use   Smoking status: Former    Current packs/day: 0.00    Types: Cigarettes    Quit date: 27    Years since quitting: 36.3   Smokeless tobacco: Not on file  Substance and Sexual Activity   Alcohol use: Not on file   Drug use: Not on file   Sexual activity: Not on file  Other Topics Concern   Not on file  Social  History Narrative   Not on file   Social Drivers of Health   Financial Resource Strain: Not on file  Food Insecurity: Not on file  Transportation Needs: Not on file  Physical Activity: Not on file  Stress: Not on file  Social Connections: Unknown (11/09/2021)   Received from [facility], [facility]   Social Network    Social Network: Not on file  Intimate Partner Violence: Unknown (10/01/2021)   Received from [employer], [facility]   HITS    Physically Hurt: Not on file    Insult or Talk Down To: Not on file    Threaten Physical Harm: Not on file    Scream or Curse: Not on file     REVIEW OF SYSTEMS:   [X]  denotes positive finding, [ ]  denotes negative finding Cardiac  Comments:  Chest pain or chest pressure:    Shortness of breath upon exertion:    Short of breath when lying flat:    Irregular heart rhythm:        Vascular    Pain in calf, thigh, or hip brought on by ambulation:    Pain in feet at night that wakes you up from your sleep:     Blood clot in your veins:    Leg swelling:         Pulmonary    Oxygen at home:    Productive cough:     Wheezing:         Neurologic    Sudden weakness in arms or legs:     Sudden numbness in arms or legs:     Sudden onset of difficulty speaking or slurred speech:    Temporary loss of vision in one eye:     Problems with dizziness:         Gastrointestinal    Blood in stool:     Vomited blood:         Genitourinary    Burning when urinating:     Blood in urine:        Psychiatric    Major depression:         Hematologic    Bleeding problems:    Problems with blood clotting too easily:        Skin    Rashes or ulcers:        Constitutional    Fever or chills:      PHYSICAL EXAMINATION:  Today's Vitals   10/22/23 1047 10/22/23 1048  BP:  109/64  Pulse:  62  Temp:  98.3 F (36.8 C)  TempSrc:  Temporal  SpO2:  96%  Weight:  130 lb 6.4 oz (59.1 kg)  Height:  5\' 1"  (1.549 m)  PainSc: 9      Body mass index is 24.64 kg/m.   General:  WDWN in NAD; vital signs documented above Gait: Not observed HENT: WNL, normocephalic Pulmonary: normal non-labored breathing , without wheezing Cardiac: regular HR, without carotid bruits Abdomen: soft, NT; aortic pulse is not palpable Skin: without rashes Vascular Exam/Pulses:  Right Left  Radial 2+ (normal) 2+ (normal)  Femoral 2+ (normal) 2+ (normal)  DP Faintly palpable and brisk doppler flow 1+ (weak)  PT absent Brisk doppler flow  Peroneal Brisk doppler flow absent   Extremities: without ischemic changes, without Gangrene , without cellulitis; without open wounds Musculoskeletal: no muscle wasting or atrophy  Neurologic: A&O X 3 Psychiatric:  The pt has Normal affect.   Non-Invasive Vascular Imaging:   ABI's/TBI's on 09/30/2023: Right:  0.88/0.52 - Great toe pressure: 64 Left:  1.11/0.74 - Great toe pressure: 91  Arterial duplex on 09/30/2023: +-----------+--------+-----+---------------+----------+--------+  RIGHT     PSV cm/sRatioStenosis       Waveform  Comments  +-----------+--------+-----+---------------+----------+--------+  CFA Distal 156                         biphasic            +-----------+--------+-----+---------------+----------+--------+  DFA       148                         biphasic            +-----------+--------+-----+---------------+----------+--------+  SFA Prox   160          30-49% stenosisbiphasic            +-----------+--------+-----+---------------+----------+--------+  SFA Mid    183          30-49% stenosisbiphasic            +-----------+--------+-----+---------------+----------+--------+  SFA Distal 129                         monophasic          +-----------+--------+-----+---------------+----------+--------+  POP Prox   96                          monophasic          +-----------+--------+-----+---------------+----------+--------+  POP  Distal 76                          monophasic          +-----------+--------+-----+---------------+----------+--------+  ATA Distal 95                          monophasic          +-----------+--------+-----+---------------+----------+--------+  PTA Distal 29                          monophasic          +-----------+--------+-----+---------------+----------+--------+  PERO Distal14                          monophasic          +-----------+--------+-----+---------------+----------+--------+   Summary:  Right: 30-49% stenosis noted in the superficial femoral artery   Previous ABI's/TBI's  on 09/19/2022: Right:  0.66/0.11 - Great toe pressure: 13 Left:  1.10/0.63 - Great toe pressure:  76     ASSESSMENT/PLAN:: 78 y.o. female here for follow up for PAD with hx of aortogram with laser arthrectomy right SFA, popliteal artery and anterior tibial arteries, drug-coated balloon angioplasty right SFA and popliteal artery with and plain balloon angioplasty right anterior tibial artery via left CFA on 09/08/2023 by Dr. Vikki Graves for rest pain   -mild swelling right leg post revascularization that is not unexpected.  Her ABI has improved  to 0.88 and toe pressure improved significantly and pre op rest pain has also improved.  She has mild narrowing in the right SFA on duplex.   -She does have some pain with the swelling around the right lower leg and ankle.  The swelling improves after being in bed overnight.   -she will continue to wear her knee high compression socks.   -continue asa/statin/plavix  -discussed with Dr. Vikki Graves and will have pt will f/u in 3 months with ABI and RLE arterial duplex and see him.  She will call sooner if any issues before then.  -discussed with her that she can take some Advil for short term to see if she gets some relief and to make sure she eats when she takes it.  Would not recommend long term due to risk of GIB with Plavix .      Maryanna Smart,  [facility] [phone]  Clinic MD:   Vikki Graves

## 2023-12-02 ENCOUNTER — Telehealth: Payer: Self-pay

## 2023-12-02 NOTE — Telephone Encounter (Signed)
 Patient was scheduled for ABD angiogram w/ LLE Runoff but has cancelled procedure.

## 2023-12-22 ENCOUNTER — Ambulatory Visit
Admission: RE | Admit: 2023-12-22 | Discharge: 2023-12-22 | Disposition: A | Discharge status: Home / Self Care | Source: Ambulatory Visit | Attending: Family Medicine

## 2023-12-22 DIAGNOSIS — Z1231 Encounter for screening mammogram for malignant neoplasm of breast: Secondary | ICD-10-CM | POA: Diagnosis not present

## 2024-01-12 ENCOUNTER — Ambulatory Visit (HOSPITAL_BASED_OUTPATIENT_CLINIC_OR_DEPARTMENT_OTHER)
Admission: RE | Admit: 2024-01-12 | Discharge: 2024-01-12 | Disposition: A | Discharge status: Home / Self Care | Source: Ambulatory Visit | Attending: Family Medicine | Admitting: Family Medicine

## 2024-01-12 DIAGNOSIS — Z780 Asymptomatic menopausal state: Secondary | ICD-10-CM | POA: Diagnosis not present

## 2024-01-12 DIAGNOSIS — M8589 Other specified disorders of bone density and structure, multiple sites: Secondary | ICD-10-CM | POA: Diagnosis not present

## 2024-01-12 DIAGNOSIS — Z13820 Encounter for screening for osteoporosis: Secondary | ICD-10-CM | POA: Insufficient documentation

## 2024-01-12 DIAGNOSIS — Z1231 Encounter for screening mammogram for malignant neoplasm of breast: Secondary | ICD-10-CM | POA: Insufficient documentation

## 2024-01-22 ENCOUNTER — Other Ambulatory Visit: Payer: Self-pay

## 2024-01-26 ENCOUNTER — Other Ambulatory Visit: Payer: Self-pay | Admitting: *Deleted

## 2024-01-26 DIAGNOSIS — I70221 Atherosclerosis of native arteries of extremities with rest pain, right leg: Secondary | ICD-10-CM

## 2024-01-28 DIAGNOSIS — M1611 Unilateral primary osteoarthritis, right hip: Secondary | ICD-10-CM | POA: Diagnosis not present

## 2024-01-28 DIAGNOSIS — M7061 Trochanteric bursitis, right hip: Secondary | ICD-10-CM | POA: Diagnosis not present

## 2024-02-04 ENCOUNTER — Encounter (HOSPITAL_COMMUNITY)

## 2024-02-04 ENCOUNTER — Ambulatory Visit: Admitting: Vascular Surgery

## 2024-02-11 ENCOUNTER — Ambulatory Visit (HOSPITAL_COMMUNITY)
Admission: RE | Admit: 2024-02-11 | Discharge: 2024-02-11 | Disposition: A | Discharge status: Home / Self Care | Source: Ambulatory Visit | Attending: Vascular Surgery | Admitting: Vascular Surgery

## 2024-02-11 ENCOUNTER — Ambulatory Visit (HOSPITAL_BASED_OUTPATIENT_CLINIC_OR_DEPARTMENT_OTHER)
Admission: RE | Admit: 2024-02-11 | Discharge: 2024-02-11 | Disposition: A | Discharge status: Home / Self Care | Source: Ambulatory Visit | Attending: Vascular Surgery | Admitting: Vascular Surgery

## 2024-02-11 ENCOUNTER — Encounter: Payer: Self-pay | Admitting: Vascular Surgery

## 2024-02-11 ENCOUNTER — Ambulatory Visit: Attending: Vascular Surgery | Admitting: Vascular Surgery

## 2024-02-11 VITALS — BP 132/63 | HR 52 | Temp 98.0°F | Resp 16 | Ht 61.0 in | Wt 123.5 lb

## 2024-02-11 DIAGNOSIS — I70221 Atherosclerosis of native arteries of extremities with rest pain, right leg: Secondary | ICD-10-CM | POA: Diagnosis not present

## 2024-02-11 DIAGNOSIS — I70222 Atherosclerosis of native arteries of extremities with rest pain, left leg: Secondary | ICD-10-CM

## 2024-02-11 LAB — VAS US ABI WITH/WO TBI
Left ABI: 1.1
Right ABI: 0.52

## 2024-02-11 NOTE — Progress Notes (Signed)
 Patient ID: Shannon Jenkins, female   DOB: Feb 13, 1946, 78 y.o.   MRN: [id number]  Reason for Consult: Follow-up   Referred by Rolinda Millman, MD  Subjective:     HPI:  Shannon Jenkins is a 78 y.o. female underwent laser arthrectomy of her right SFA, popliteal and anterior tibial arteries in March of this year for rest pain.  She states that now she is having some numbness of the right great toe and her right foot feels slightly cool but she does not have any claudication and the rest pain has resolved.  She is here today for follow-up.  She really has no complaints related to today's visit.  Past Medical History:  Diagnosis Date   Hypertension    Peripheral vascular disease (HCC)    Thyroid  disease    Family History  Problem Relation Age of Onset   Breast cancer Mother    Breast cancer Maternal Aunt    Past Surgical History:  Procedure Laterality Date   ABDOMINAL AORTOGRAM W/LOWER EXTREMITY N/A 09/08/2023   Procedure: ABDOMINAL AORTOGRAM W/LOWER EXTREMITY;  Surgeon: Sheree Penne Bruckner, MD;  Location: [facility] INVASIVE CV LAB;  Service: Cardiovascular;  Laterality: N/A;   CESAREAN SECTION     CHOLECYSTECTOMY     LOWER EXTREMITY INTERVENTION Right 09/08/2023   Procedure: LOWER EXTREMITY INTERVENTION;  Surgeon: Sheree Penne Bruckner, MD;  Location: [facility] INVASIVE CV LAB;  Service: Cardiovascular;  Laterality: Right;  SFA, POP    Short Social History:  Social History   Tobacco Use   Smoking status: Former    Current packs/day: 0.00    Types: Cigarettes    Quit date: 1989    Years since quitting: 36.6   Smokeless tobacco: Not on file  Substance Use Topics   Alcohol use: Not on file    Allergies  Allergen Reactions   Iodinated Contrast Media Other (See Comments)    Caused Pt to get very hot, and could have caused Pt to go in shock    Lisinopril Cough   Penicillins Hives and Rash    Current Outpatient Medications  Medication Sig Dispense Refill   aspirin  EC 81 MG  tablet Take 81 mg by mouth daily. Swallow whole.     Biotin 5000 MCG TABS Take 5,000 mcg by mouth daily.     Budeson-Glycopyrrol-Formoterol (BREZTRI  AEROSPHERE) 160-9-4.8 MCG/ACT AERO Inhale 2 puffs into the lungs in the morning and at bedtime. (Patient taking differently: Inhale 2 puffs into the lungs every morning.) 10.7 g 4   Calcium-Magnesium-Zinc (CAL-MAG-ZINC PO) Take 1 tablet by mouth daily.     Cholecalciferol (VITAMIN D3) 50 MCG (2000 UT) capsule Take 2,000 Units by mouth daily.     clopidogrel  (PLAVIX ) 75 MG tablet Take 1 tablet (75 mg total) by mouth daily. 30 tablet 11   hydrochlorothiazide (HYDRODIURIL) 12.5 MG tablet Take 12.5 mg by mouth every morning.     Multiple Vitamins-Minerals (MULTIVITAMIN WITH MINERALS) tablet Take 1 tablet by mouth daily.     olmesartan (BENICAR) 40 MG tablet Take 40 mg by mouth daily.     Probiotic Product (PROBIOTIC DAILY PO) Take 1 capsule by mouth daily.     rosuvastatin (CRESTOR) 20 MG tablet Take 20 mg by mouth 2 (two) times a week. Mon and Thurs     SYNTHROID 50 MCG tablet Take 50-75 mcg by mouth See admin instructions. 50 mcg Mon-Fri, 75 mcg Sat-Sun     No current facility-administered medications for this visit.    Review  of Systems  Constitutional:  Constitutional negative. HENT: HENT negative.  Eyes: Eyes negative.  Cardiovascular: Cardiovascular negative.  GI: Gastrointestinal negative.  Musculoskeletal:       Cool right foot Skin: Skin negative.  Neurological: Positive for numbness.  Hematologic: Hematologic/lymphatic negative.  Psychiatric: Psychiatric negative.        Objective:  Objective   Vitals:   02/11/24 1059  BP: 132/63  Pulse: (!) 52  Resp: 16  Temp: 98 F (36.7 C)  TempSrc: Temporal  SpO2: 99%  Weight: 123 lb 8 oz (56 kg)  Height: 5' 1 (1.549 m)   Body mass index is 23.34 kg/m.  Physical Exam HENT:     Head: Normocephalic.  Cardiovascular:     Pulses:          Femoral pulses are 2+ on the right  side and 2+ on the left side.      Popliteal pulses are 0 on the right side and 0 on the left side.  Pulmonary:     Effort: Pulmonary effort is normal.  Abdominal:     General: Abdomen is flat.  Musculoskeletal:        General: Normal range of motion.     Right lower leg: No edema.     Left lower leg: No edema.     Comments: Right great toe is cold to touch  Skin:    General: Skin is warm.     Capillary Refill: Capillary refill takes more than 3 seconds.  Neurological:     General: No focal deficit present.     Mental Status: She is alert.  Psychiatric:        Mood and Affect: Mood normal.     Data: ABI Findings:  +---------+------------------+-----+----------+--------+  Right   Rt Pressure (mmHg)IndexWaveform  Comment   +---------+------------------+-----+----------+--------+  Brachial 128                                        +---------+------------------+-----+----------+--------+  ATA     56                0.43 monophasic          +---------+------------------+-----+----------+--------+  PTA     68                0.52 monophasic          +---------+------------------+-----+----------+--------+  Great Toe0                 0.00 Absent              +---------+------------------+-----+----------+--------+   +---------+------------------+-----+-----------+-------+  Left    Lt Pressure (mmHg)IndexWaveform   Comment  +---------+------------------+-----+-----------+-------+  Brachial 131                                        +---------+------------------+-----+-----------+-------+  ATA     137               1.05 multiphasic         +---------+------------------+-----+-----------+-------+  PTA     132               1.01 multiphasic         +---------+------------------+-----+-----------+-------+  Great Toe70                0.53                     +---------+------------------+-----+-----------+-------+    +-------+-----------+-----------+------------+------------+  ABI/TBIToday's ABIToday's TBIPrevious ABIPrevious TBI  +-------+-----------+-----------+------------+------------+  Right 0.52       0          0.88        0.52          +-------+-----------+-----------+------------+------------+  Left  1.1        0.53       1.11        0.74          +-------+-----------+-----------+------------+------------+         Previous ABI on 09/30/23.    Summary:  Right: Resting right ankle-brachial index 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 The right toe-brachial index is abnormal.   Left: Resting left ankle-brachial index is within normal range. The left  toe-brachial index is abnormal.   RIGHT      PSV cm/sRatioStenosis       Waveform  Comments                   +-----------+--------+-----+---------------+----------+--------------------  ----+  DFA       117                         triphasic                            +-----------+--------+-----+---------------+----------+--------------------  ----+  SFA Prox   199          50-74% stenosistriphasic 75-99% by velocity  ratio  +-----------+--------+-----+---------------+----------+--------------------  ----+  SFA Mid    390          75-99% stenosismonophasic                           +-----------+--------+-----+---------------+----------+--------------------  ----+  SFA Distal 33                          monophasic                           +-----------+--------+-----+---------------+----------+--------------------  ----+  POP Prox   19                          monophasic                           +-----------+--------+-----+---------------+----------+--------------------  ----+  POP Distal 24                          monophasic                           +-----------+--------+-----+---------------+----------+--------------------  ----+  ATA  Distal 26                          monophasic                           +-----------+--------+-----+---------------+----------+--------------------  ----+  PTA Distal 8                           monophasic                           +-----------+--------+-----+---------------+----------+--------------------  ----+  PERO Distal                                      not visualized             +-----------+--------+-----+---------------+----------+--------------------  ----+   A focal velocity elevation of 199 cm/s was obtained at Prox/mid SFA with  post stenotic turbulence with a VR of 9.0. Findings are characteristic of  50-74% stenosis. A 2nd focal velocity elevation was visualized, measuring  389 cm/s at mid SFA with post  stenotic turbulence with a VR of 12.2. Findings are characteristic of  75-99% stenosis.    Summary:  Right: 50-74% prox/mid SFA stenosis by velocity but 75-99% by velocity  ratio.  75-99% mid SFA stenosis.       Assessment/Plan:     78 year old female with history of right lower extremity intervention for rest pain 5 months ago.  Rest pain has resolved she does have some recurrent numbness and coolness to the right great toe.  I discussed with her that she has very diminutive vessels in the right lower extremity in reviewing her most recent angiography and there is recurrence of stenosis possibly subtotal occlusion of the SFA.  I discussed repeating angiography from the left common femoral approach but this time the patient wants more time.  She will follow-up in 3 to 4 months with repeat noninvasive studies.  If she develops worsening symptoms in the interim she can be scheduled for angiography to evaluate the right lower extremity from the left common femoral approach for occlusion of her right SFA without being reevaluated.  If she does have questions I could either see her in the office or have a phone visit with her prior to 3 to 4 months as  well.  All questions were answered today and she demonstrates good understanding.-    Penne Lonni Colorado MD [facility]

## 2024-02-13 ENCOUNTER — Other Ambulatory Visit: Payer: Self-pay

## 2024-02-13 DIAGNOSIS — I70222 Atherosclerosis of native arteries of extremities with rest pain, left leg: Secondary | ICD-10-CM

## 2024-04-02 DIAGNOSIS — E890 Postprocedural hypothyroidism: Secondary | ICD-10-CM | POA: Diagnosis not present

## 2024-04-02 DIAGNOSIS — R809 Proteinuria, unspecified: Secondary | ICD-10-CM | POA: Diagnosis not present

## 2024-04-02 DIAGNOSIS — I10 Essential (primary) hypertension: Secondary | ICD-10-CM | POA: Diagnosis not present

## 2024-05-25 ENCOUNTER — Other Ambulatory Visit: Payer: Self-pay | Admitting: Primary Care

## 2024-05-25 NOTE — Telephone Encounter (Unsigned)
 Copied from CRM #[id number]. Topic: Clinical - Medication Refill >> May 25, 2024  8:04 AM Joesph PARAS wrote: Medication: Budeson-Glycopyrrol-Formoterol (BREZTRI  AEROSPHERE) 160-9-4.8 MCG/ACT AERO  Has the patient contacted their pharmacy? Yes (Agent: If no, request that the patient contact the pharmacy for the refill. If patient does not wish to contact the pharmacy document the reason why and proceed with request.) (Agent: If yes, when and what did the pharmacy advise?)  This is the patient's preferred pharmacy:  [id number] - [place], [place] - [address] [place] [address] Phone: [phone] Fax: [phone]  Is this the correct pharmacy for this prescription? Yes If no, delete pharmacy and type the correct one.   Has the prescription been filled recently? No  Is the patient out of the medication? Yes  Has the patient been seen for an appointment in the last year OR does the patient have an upcoming appointment? Yes  Can we respond through MyChart? Yes  Agent: Please be advised that Rx refills may take up to 3 business days. We ask that you follow-up with your pharmacy.

## 2024-05-26 MED ORDER — BREZTRI AEROSPHERE 160-9-4.8 MCG/ACT IN AERO: 2.0000 | INHALATION_SPRAY | RESPIRATORY_TRACT | 6 refills | Status: AC

## 2024-05-26 NOTE — Telephone Encounter (Signed)
 Copied from CRM #[id number]. Topic: Clinical - Prescription Issue >> May 25, 2024  8:05 AM Joesph PARAS wrote: Reason for CRM: Patient is going out of town tomorrow and is requesting a reflll of Breztri  today because she is completely out. PAS advised a max of 3 business days, patient is demanding a sample to pick up because she is going out of town and does not want to wait. Please update patient.   Called and spoke with the pt. Pt is out of town and has her inhaler with her. Pt is not out of Breztri .  I have sent new rx to preferred pharmacy. Nothing further needed.

## 2024-06-09 ENCOUNTER — Ambulatory Visit (HOSPITAL_COMMUNITY)
Admission: RE | Admit: 2024-06-09 | Discharge: 2024-06-09 | Disposition: A | Discharge status: Home / Self Care | Source: Ambulatory Visit | Attending: Vascular Surgery | Admitting: Vascular Surgery

## 2024-06-09 ENCOUNTER — Ambulatory Visit

## 2024-06-09 ENCOUNTER — Ambulatory Visit: Admitting: Physician Assistant

## 2024-06-09 ENCOUNTER — Encounter (HOSPITAL_COMMUNITY)

## 2024-06-09 VITALS — BP 106/65 | HR 61 | Temp 97.7°F | Wt 125.3 lb

## 2024-06-09 DIAGNOSIS — I70222 Atherosclerosis of native arteries of extremities with rest pain, left leg: Secondary | ICD-10-CM | POA: Diagnosis not present

## 2024-06-09 DIAGNOSIS — I739 Peripheral vascular disease, unspecified: Secondary | ICD-10-CM

## 2024-06-09 LAB — VAS US ABI WITH/WO TBI
Left ABI: 1.08
Right ABI: 0.43

## 2024-06-09 NOTE — Progress Notes (Signed)
 Office Note     CC:  follow up Requesting Provider:  Rolinda Millman, MD  HPI: Shannon Jenkins is a 78 y.o. (Dec 22, 1945) female who presents for surveillance of PAD.  She underwent laser atherectomy of her right SFA, popliteal, and ATA in March of this year for rest pain by Dr. Sheree.  Her rest pain resolved after the procedure and her ABI increased.  She was seen in August of this year by Dr. Sheree with ABI decreasing to where it was prior to intervention in March.  Fortunately she was without rest pain.  She follows up for repeat ABI for today's visit.  She continues to be without rest pain or tissue loss.  She is ambulatory without any traditional claudication symptoms.  She takes an aspirin , Plavix , statin daily.  She denies tobacco use.   Past Medical History:  Diagnosis Date   Hypertension    Peripheral vascular disease    Thyroid  disease     Past Surgical History:  Procedure Laterality Date   ABDOMINAL AORTOGRAM W/LOWER EXTREMITY N/A 09/08/2023   Procedure: ABDOMINAL AORTOGRAM W/LOWER EXTREMITY;  Surgeon: Sheree Penne Bruckner, MD;  Location: [facility] INVASIVE CV LAB;  Service: Cardiovascular;  Laterality: N/A;   CESAREAN SECTION     CHOLECYSTECTOMY     LOWER EXTREMITY INTERVENTION Right 09/08/2023   Procedure: LOWER EXTREMITY INTERVENTION;  Surgeon: Sheree Penne Bruckner, MD;  Location: [facility] INVASIVE CV LAB;  Service: Cardiovascular;  Laterality: Right;  SFA, POP    Social History   Socioeconomic History   Marital status: Single    Spouse name: Not on file   Number of children: Not on file   Years of education: Not on file   Highest education level: Not on file  Occupational History   Not on file  Tobacco Use   Smoking status: Former    Current packs/day: 0.00    Types: Cigarettes    Quit date: 62    Years since quitting: 36.9   Smokeless tobacco: Not on file  Vaping Use   Vaping status: Never Used  Substance and Sexual Activity   Alcohol use: Not on file   Drug  use: Never   Sexual activity: Not on file  Other Topics Concern   Not on file  Social History Narrative   Not on file   Social Drivers of Health   Financial Resource Strain: Not on file  Food Insecurity: Not on file  Transportation Needs: Not on file  Physical Activity: Not on file  Stress: Not on file  Social Connections: Not on file  Intimate Partner Violence: Not on file    Family History  Problem Relation Age of Onset   Breast cancer Mother    Breast cancer Maternal Aunt     Current Outpatient Medications  Medication Sig Dispense Refill   SYNTHROID 50 MCG tablet Take 50-75 mcg by mouth See admin instructions. 50 mcg Mon-Fri, 75 mcg Sat-Sun (Patient taking differently: Take 75 mcg by mouth See admin instructions. 75 mg daily)     aspirin  EC 81 MG tablet Take 81 mg by mouth daily. Swallow whole.     Biotin 5000 MCG TABS Take 5,000 mcg by mouth daily.     budesonide-glycopyrrolate-formoterol (BREZTRI  AEROSPHERE) 160-9-4.8 MCG/ACT AERO inhaler Inhale 2 puffs into the lungs every morning. 10.7 g 6   Calcium-Magnesium-Zinc (CAL-MAG-ZINC PO) Take 1 tablet by mouth daily.     Cholecalciferol (VITAMIN D3) 50 MCG (2000 UT) capsule Take 2,000 Units by mouth daily.  clopidogrel  (PLAVIX ) 75 MG tablet Take 1 tablet (75 mg total) by mouth daily. 30 tablet 11   hydrochlorothiazide (HYDRODIURIL) 12.5 MG tablet Take 12.5 mg by mouth every morning.     Multiple Vitamins-Minerals (MULTIVITAMIN WITH MINERALS) tablet Take 1 tablet by mouth daily.     olmesartan (BENICAR) 40 MG tablet Take 40 mg by mouth daily.     Probiotic Product (PROBIOTIC DAILY PO) Take 1 capsule by mouth daily.     rosuvastatin (CRESTOR) 20 MG tablet Take 20 mg by mouth 2 (two) times a week. Mon and Thurs     No current facility-administered medications for this visit.    Allergies  Allergen Reactions   Iodinated Contrast Media Other (See Comments)    Caused Pt to get very hot, and could have caused Pt to go in  shock    Lisinopril Cough   Penicillins Hives and Rash     REVIEW OF SYSTEMS:  Negative unless noted in HPI [X]  denotes positive finding, [ ]  denotes negative finding Cardiac  Comments:  Chest pain or chest pressure:    Shortness of breath upon exertion:    Short of breath when lying flat:    Irregular heart rhythm:        Vascular    Pain in calf, thigh, or hip brought on by ambulation:    Pain in feet at night that wakes you up from your sleep:     Blood clot in your veins:    Leg swelling:         Pulmonary    Oxygen at home:    Productive cough:     Wheezing:         Neurologic    Sudden weakness in arms or legs:     Sudden numbness in arms or legs:     Sudden onset of difficulty speaking or slurred speech:    Temporary loss of vision in one eye:     Problems with dizziness:         Gastrointestinal    Blood in stool:     Vomited blood:         Genitourinary    Burning when urinating:     Blood in urine:        Psychiatric    Major depression:         Hematologic    Bleeding problems:    Problems with blood clotting too easily:        Skin    Rashes or ulcers:        Constitutional    Fever or chills:      PHYSICAL EXAMINATION:  Vitals:   06/09/24 0906  BP: 106/65  Pulse: 61  Temp: 97.7 F (36.5 C)  TempSrc: Temporal  Weight: 125 lb 4.8 oz (56.8 kg)    General:  WDWN in NAD; vital signs documented above Gait: Not observed HENT: WNL, normocephalic Pulmonary: normal non-labored breathing Cardiac: regular HR Abdomen: soft, NT, no masses Skin: without rashes Vascular Exam/Pulses: Absent pedal pulses Extremities: without ischemic changes, without Gangrene , without cellulitis; without open wounds;  Musculoskeletal: no muscle wasting or atrophy  Neurologic: A&O X 3 Psychiatric:  The pt has Normal affect.   Non-Invasive Vascular Imaging:   ABI/TBIToday's ABIToday's TBIPrevious ABIPrevious TBI   +-------+-----------+-----------+------------+------------+  Right 0.43       0          0.52        0             +-------+-----------+-----------+------------+------------+  Left  1.08       0.55       1.1         .053          +-------+-----------+-----------+------------+------------+      ASSESSMENT/PLAN:: 78 y.o. female here for follow up for surveillance of PAD  Ms. Stenseth is a 78 year old female who underwent endovascular intervention of the right lower extremity in March due to rest pain.  ABIs initially improved however returned to preoperative level at her visit in August.  Fortunately she continued to be without rest pain.  We again see diminished right ABI as well as a toe pressure of 0.  Fortunately she continues to be without rest pain, claudication, or tissue loss of the right lower extremity.  She will continue her aspirin , Plavix , statin daily.  I encouraged her to walk for exercise to promote collateral flow.  If she develops any rest pain or tissue loss in the future we will repeat angiography.  For now she will follow-up in 6 months with a repeat right leg duplex and ABI.   Donnice Sender, PA-C [facility] [phone]  Clinic MD:   Sheree

## 2024-06-10 ENCOUNTER — Other Ambulatory Visit: Payer: Self-pay | Admitting: *Deleted

## 2024-06-10 DIAGNOSIS — I70222 Atherosclerosis of native arteries of extremities with rest pain, left leg: Secondary | ICD-10-CM

## 2024-06-10 DIAGNOSIS — I739 Peripheral vascular disease, unspecified: Secondary | ICD-10-CM

## 2024-06-11 ENCOUNTER — Other Ambulatory Visit

## 2024-06-28 DIAGNOSIS — B349 Viral infection, unspecified: Secondary | ICD-10-CM | POA: Diagnosis not present

## 2024-09-08 ENCOUNTER — Ambulatory Visit: Admitting: Primary Care

## 2024-12-08 ENCOUNTER — Ambulatory Visit (HOSPITAL_COMMUNITY)

## 2024-12-08 ENCOUNTER — Ambulatory Visit
